# Patient Record
Sex: Female | Born: 1935 | Race: White | Hispanic: No | Marital: Married | Smoking: Never smoker
Health system: Southern US, Community
[De-identification: ages and names within clinical notes are randomized; demographics above are authoritative.]

---

## 2010-07-07 DIAGNOSIS — Z8673 Personal history of transient ischemic attack (TIA), and cerebral infarction without residual deficits: Secondary | ICD-10-CM | POA: Insufficient documentation

## 2010-07-27 DIAGNOSIS — K219 Gastro-esophageal reflux disease without esophagitis: Secondary | ICD-10-CM | POA: Insufficient documentation

## 2010-07-27 DIAGNOSIS — I639 Cerebral infarction, unspecified: Secondary | ICD-10-CM | POA: Insufficient documentation

## 2010-07-27 HISTORY — DX: Gastro-esophageal reflux disease without esophagitis: K21.9

## 2010-07-27 HISTORY — DX: Cerebral infarction, unspecified: I63.9

## 2012-10-16 DIAGNOSIS — S43006A Unspecified dislocation of unspecified shoulder joint, initial encounter: Secondary | ICD-10-CM

## 2012-10-16 HISTORY — DX: Unspecified dislocation of unspecified shoulder joint, initial encounter: S43.006A

## 2012-12-06 DIAGNOSIS — Z8739 Personal history of other diseases of the musculoskeletal system and connective tissue: Secondary | ICD-10-CM | POA: Insufficient documentation

## 2012-12-06 DIAGNOSIS — Z87828 Personal history of other (healed) physical injury and trauma: Secondary | ICD-10-CM

## 2012-12-06 HISTORY — DX: Personal history of other diseases of the musculoskeletal system and connective tissue: Z87.39

## 2012-12-06 HISTORY — DX: Personal history of other (healed) physical injury and trauma: Z87.828

## 2012-12-21 DIAGNOSIS — M75100 Unspecified rotator cuff tear or rupture of unspecified shoulder, not specified as traumatic: Secondary | ICD-10-CM

## 2012-12-21 HISTORY — DX: Unspecified rotator cuff tear or rupture of unspecified shoulder, not specified as traumatic: M75.100

## 2014-07-02 DIAGNOSIS — E7800 Pure hypercholesterolemia, unspecified: Secondary | ICD-10-CM

## 2014-07-02 DIAGNOSIS — I872 Venous insufficiency (chronic) (peripheral): Secondary | ICD-10-CM

## 2014-07-02 DIAGNOSIS — D649 Anemia, unspecified: Secondary | ICD-10-CM

## 2014-07-02 DIAGNOSIS — G4700 Insomnia, unspecified: Secondary | ICD-10-CM

## 2014-07-02 DIAGNOSIS — M810 Age-related osteoporosis without current pathological fracture: Secondary | ICD-10-CM | POA: Insufficient documentation

## 2014-07-02 DIAGNOSIS — R2681 Unsteadiness on feet: Secondary | ICD-10-CM

## 2014-07-02 DIAGNOSIS — J309 Allergic rhinitis, unspecified: Secondary | ICD-10-CM | POA: Insufficient documentation

## 2014-07-02 DIAGNOSIS — N3941 Urge incontinence: Secondary | ICD-10-CM | POA: Insufficient documentation

## 2014-07-02 HISTORY — DX: Pure hypercholesterolemia, unspecified: E78.00

## 2014-07-02 HISTORY — DX: Insomnia, unspecified: G47.00

## 2014-07-02 HISTORY — DX: Unsteadiness on feet: R26.81

## 2014-07-02 HISTORY — DX: Age-related osteoporosis without current pathological fracture: M81.0

## 2014-07-02 HISTORY — DX: Urge incontinence: N39.41

## 2014-07-02 HISTORY — DX: Allergic rhinitis, unspecified: J30.9

## 2014-07-02 HISTORY — DX: Venous insufficiency (chronic) (peripheral): I87.2

## 2014-07-02 HISTORY — DX: Anemia, unspecified: D64.9

## 2016-09-06 DIAGNOSIS — F01518 Vascular dementia, unspecified severity, with other behavioral disturbance: Secondary | ICD-10-CM | POA: Insufficient documentation

## 2016-09-06 DIAGNOSIS — G301 Alzheimer's disease with late onset: Secondary | ICD-10-CM | POA: Insufficient documentation

## 2016-09-06 DIAGNOSIS — F0150 Vascular dementia without behavioral disturbance: Secondary | ICD-10-CM | POA: Insufficient documentation

## 2020-01-18 DIAGNOSIS — J3089 Other allergic rhinitis: Secondary | ICD-10-CM | POA: Insufficient documentation

## 2020-01-18 HISTORY — DX: Other allergic rhinitis: J30.89

## 2020-05-01 ENCOUNTER — Ambulatory Visit: Payer: Medicare Other | Attending: Family Medicine | Admitting: Physical Therapy

## 2020-05-01 ENCOUNTER — Ambulatory Visit (INDEPENDENT_AMBULATORY_CARE_PROVIDER_SITE_OTHER): Payer: Medicare Other | Admitting: Family Medicine

## 2020-05-01 ENCOUNTER — Encounter: Payer: Self-pay | Admitting: Physical Therapy

## 2020-05-01 ENCOUNTER — Other Ambulatory Visit: Payer: Self-pay

## 2020-05-01 DIAGNOSIS — G8929 Other chronic pain: Secondary | ICD-10-CM | POA: Insufficient documentation

## 2020-05-01 DIAGNOSIS — M25612 Stiffness of left shoulder, not elsewhere classified: Secondary | ICD-10-CM | POA: Diagnosis not present

## 2020-05-01 DIAGNOSIS — R2681 Unsteadiness on feet: Secondary | ICD-10-CM | POA: Diagnosis present

## 2020-05-01 DIAGNOSIS — I10 Essential (primary) hypertension: Secondary | ICD-10-CM | POA: Diagnosis not present

## 2020-05-01 DIAGNOSIS — M25471 Effusion, right ankle: Secondary | ICD-10-CM

## 2020-05-01 DIAGNOSIS — J45909 Unspecified asthma, uncomplicated: Secondary | ICD-10-CM

## 2020-05-01 DIAGNOSIS — R531 Weakness: Secondary | ICD-10-CM

## 2020-05-01 DIAGNOSIS — K219 Gastro-esophageal reflux disease without esophagitis: Secondary | ICD-10-CM

## 2020-05-01 DIAGNOSIS — G301 Alzheimer's disease with late onset: Secondary | ICD-10-CM | POA: Diagnosis not present

## 2020-05-01 DIAGNOSIS — M25512 Pain in left shoulder: Secondary | ICD-10-CM

## 2020-05-01 DIAGNOSIS — E7800 Pure hypercholesterolemia, unspecified: Secondary | ICD-10-CM | POA: Diagnosis not present

## 2020-05-01 DIAGNOSIS — R351 Nocturia: Secondary | ICD-10-CM

## 2020-05-01 DIAGNOSIS — Z8673 Personal history of transient ischemic attack (TIA), and cerebral infarction without residual deficits: Secondary | ICD-10-CM

## 2020-05-01 DIAGNOSIS — M25472 Effusion, left ankle: Secondary | ICD-10-CM

## 2020-05-01 DIAGNOSIS — M79602 Pain in left arm: Secondary | ICD-10-CM | POA: Diagnosis not present

## 2020-05-01 DIAGNOSIS — M6281 Muscle weakness (generalized): Secondary | ICD-10-CM | POA: Diagnosis present

## 2020-05-01 HISTORY — DX: Personal history of transient ischemic attack (TIA), and cerebral infarction without residual deficits: Z86.73

## 2020-05-01 HISTORY — DX: Nocturia: R35.1

## 2020-05-01 HISTORY — DX: Pure hypercholesterolemia, unspecified: E78.00

## 2020-05-01 HISTORY — DX: Essential (primary) hypertension: I10

## 2020-05-01 HISTORY — DX: Unspecified asthma, uncomplicated: J45.909

## 2020-05-01 MED ORDER — FAMOTIDINE 20 MG PO TABS: 20.0000 mg | ORAL_TABLET | Freq: Two times a day (BID) | ORAL | 3 refills | Status: DC

## 2020-05-01 NOTE — Progress Notes (Signed)
S: Pharmacy consulted to complete medication reconciliation for geriatric clinic. Patient arrives in good spirits today accompanied with daughter. Reports that granddaughter, who is a Engineer, civil (consulting), is concerned about several of the patient's medication, including use of clopidogrel instead of aspirin, and the "blood pressure medications." Patient also reports swelling in ankles. Patient denies symptoms of acid reflux (burning, regurgitation). Patient and daughter cannot recall date of stroke, although problem list indicates stroke may have occurred in 2011, and denies use of aspirin in the past, or allergy to aspirin. Daughter reports that duloxetine was started after stroke due to "anxiousness from the stroke.".   O:  BP 118/74  Medication concerns identified:   Olmesartan 20 mg daily   Amlodipine-benazepril 5-10 mg daily  Omeprazole 40 mg daily  Duloxetine 30 mg daily  Clopidogrel 75 mg daily    A/P: Patient concurrently taking ACE and ARB which increases risk for hyperkalemia and AKI. Recommend discontinuing amlodipine-benazpril combination medication, and continuing olmesartan 20 mg daily, given sx's of peripheral edema in the lower extremities. BP is at goal today, will continue to monitor need for additional therapy. Patient taking proton pump inhibitor without symptoms of acid reflux. Given patient's age and coexisting osteopenia and after discussion with Dr. Georgeann Oppenheim, will plan to discontinue omeprazole 40 mg and start famotidine 20 mg BID. Patient is unsure if she is receiving clinical benefit from duloxetine as she has been taking the medication continuously since her stroke occurred, without a trial off. Can consider discontinuing/tapering off in future visits. Unable to discern if patient has had a trial of aspirin in the past. If family wishes to switch from clopidogrel to aspirin, may consider doing so today or at future visits.  Patient was seen by Cordella Register, PharmD Candidate 2022  and Fabio Neighbors, PGY2 Pharmacy Resident.

## 2020-05-01 NOTE — Addendum Note (Signed)
Addended by: Jenelle Mages on: 05/01/2020 04:46 PM   Modules accepted: Orders

## 2020-05-01 NOTE — Patient Instructions (Addendum)
                 Patient was evaluated by Physical Therapist today during multi-disciplinary Geriatric Clinic. Screening questions and medication/history review were completed by the medical team. Please see MD encounter for full assessment.   Garen Lah, PT Certified Exercise Expert for the Aging Adult  [facility]- Aquatic Therapy Certified 05/01/20 3:44 PM Phone: [phone] Fax: [phone]

## 2020-05-01 NOTE — Patient Instructions (Addendum)
It was so very nice to meet you Sharon Dodson.  PLease consider stopping the medication Lotrel (Amlodipine & Benazepril).  Please continue omlesartan   Please stop the medication Namenda (memantine)  We will talk about stopping omeprazole and duloxetine at our next visit.

## 2020-05-01 NOTE — Therapy (Addendum)
[facility] [address] [place], [place], [address] Phone: [phone]   Fax:  [phone]  Physical Therapy Evaluation  Patient Details  Name: Sharon Dodson MRN: [id number] Date of Birth: Mar 03, 1932 Referring Provider (PT): McDiarmid, Tawanna Cooler MD   Encounter Date: 05/01/2020   PT End of Session - 05/01/20 1607    Visit Number 1    Number of Visits 1    Date for PT Re-Evaluation 05/01/20    Authorization Type MCR    Progress Note Due on Visit 10    PT Start Time 1515    PT Stop Time 1554    PT Time Calculation (min) 39 min    Activity Tolerance Patient tolerated treatment well    Behavior During Therapy [facility] for tasks assessed/performed           Past Medical History:  Diagnosis Date  . Allergic rhinitis 07/02/2014  . Anemia 07/02/2014  . Benign essential HTN 05/01/2020  . Environmental and seasonal allergies 01/18/2020  . H/O reduction of closed dislocation 12/06/2012  . History of cerebral infarction 05/01/2020   Formatting of this note might be different from the original. 07/2010, RIGHT parietal lobe, without residual effects  . Nocturia 05/01/2020  . Pure hypercholesterolemia 05/01/2020  . RCT (rotator cuff tear) 12/21/2012  . Shoulder dislocation 10/16/2012  . Uncomplicated asthma 05/01/2020  . Unsteady gait 07/02/2014  . Urgency incontinence 07/02/2014  . Venous insufficiency 07/02/2014    History reviewed. No pertinent surgical history.  There were no vitals filed for this visit.        [facility] PT Assessment - 05/01/20 0001      Assessment   Medical Diagnosis unsteadiness on feet, LT shld pain and generalized muscle weakness    Referring Provider (PT) McDiarmid, Todd MD      Balance Screen   Has the patient fallen in the past 6 months No   fell 5 years ago and injured shoulder   Has the patient had a decrease in activity level because of a fear of falling?  No    Is the patient reluctant to leave their home  because of a fear of falling?  No      Observation/Other Assessments   Focus on Therapeutic Outcomes (FOTO)  NA  Geriatric Clinic      ROM / Strength   AROM / PROM / Strength AROM      AROM   Overall AROM  Deficits    Right Shoulder Flexion 150 Degrees    Right Shoulder ABduction 140 Degrees    Left Shoulder Flexion 70 Degrees    Left Shoulder ABduction 62 Degrees    Right Hip Flexion 80    Left Hip Flexion 80    Right Knee Extension 5    Right Knee Flexion 110    Left Knee Extension 5    Left Knee Flexion 115      Strength   Overall Strength Deficits    Overall Strength Comments grossly weaker in proximal hips than distal , Pt sits a lot at home and is decondeitioned.    grossly 3/5 to 4-/5           [facility] Pre-Surgical Assessment - 05/01/20 0001    5 Meter Walk Test- trial 1 10.4 sec    5 Meter Walk Test- trial 2 10 sec.     5 Meter Walk Test- trial 3 10.6 sec.    5 meter walk test average 10.33 sec  4 Stage Balance Test tolerated for:  4 sec.    4 Stage Balance Test Position 3    comment SPPB 6/12    Sit To Stand Test- trial 1 19.6 sec.    Comment > 15 sec risk for recurrent falls                    Objective measurements completed on examination: See above findings.          Access Code: [email or web address]: [email or web address]: 08/26/2021Prepared by: Wayland Denis BeardsleyExercises  Sit to Stand with Counter Support - 3 x daily - 7 x weekly - 1 sets - 5-10 reps  Standing Hip Abduction with Counter Support - 1 x daily - 7 x weekly - 2 sets - 10 reps  Standing March with Counter Support - 1 x daily - 7 x weekly - 2 sets - 10 reps  Standing Hip Extension with Counter Support - 1 x daily - 7 x weekly - 2 sets - 10 reps         PT Short Term Goals - 05/01/20 1641      PT SHORT TERM GOAL #1   Title Pt will recieve and demo initial HEP    Baseline no knowledge    Time 1    Period Days    Status Achieved    Target Date 05/01/20                Clinical Impresssion Statement 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 Pt presents with onset of shoulder pain for around 5 years and was recommended for  TSR of LT shoulder. Before her move. Her dtr reported she holds onto walls in order for mobility.  She does have a cane to use and PT observed pt blocking knees for stability with standing.    Symptoms are limiting balance /strength . See Flowsheet for AROM/strength  , Decreased  balance and score of 3/4 on. High fall risk 4 stage balance test.  1.65ft/sec walking speed. Pt performed 5xSTS in 19.59 seconds which is indicative of high fall risk and decreased LE strength   Based on Short Physical Performance Battery, pt has a frailty rating of 6/12 with </= 5/12 considered frail and a score of </= 10/12 indicates one or more mobility limitations.  Pt would benefit from skilled PT to address impairments and decrease fall risk.  Pt given RX for continued PT when Pt/family feels safer to venture outside of home due to COVID concerns    Household Ambulator ;1.31 ft/sec or below: Limited ommunity ambulator; 1.31 ft/sec to2.62 ft/sec : [employer]; 2.62 ft/sec and above: Able to safely cross streets   Patient was evaluated by Physical Therapist today during multi-disciplinary Geriatric Clinic. Screening questions and medication/history review were completed by the medical team. Please see MD encounter for full assessment.      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 Pt presents with onset of shoulder pain for around 5 years and was recommended for  TSR of LT shoulder. Before her move. Her dtr reported she holds onto walls in order for mobility.  She does have a cane to use and PT observed pt blocking knees for stability with standing.    Symptoms are  limiting balance /strength . See Flowsheet for AROM/strength  ,  Decreased  balance and score of 3/4 on. High fall risk 4 stage balance test.  1.24ft/sec walking speed. Pt performed 5xSTS in 19.59 seconds which is indicative of high fall risk and decreased LE strength  Based on Short Physical Performance Battery, pt has a frailty rating of 6/12 with </= 5/12 considered frail and a score of </= 10/12 indicates one or more mobility limitations.  Pt would benefit from skilled PT to address impairments and decrease fall risk.  Pt given RX for continued PT when Pt/family feels safer to venture outside of home. Household Ambulator ;1.31 ft/sec or below: Limited ommunity ambulator; 1.31 ft/sec to2.62 ft/sec : [employer]; 2.62 ft/sec and above: Able to safely cross streets .  Pt was given RX to use for PT when pt/dtr feel safe to make additional appt/re evaluation.    Stability/Clinical Decision Making Evolving/Moderate complexity    Clinical Decision Making Moderate    Rehab Potential Good    PT Frequency One time visit    PT Treatment/Interventions Functional mobility training;Gait training;Therapeutic exercise    PT Home Exercise Plan HEP for LE strength/balance    Consulted and Agree with Plan of Care Patient;Family member/caregiver           Patient will benefit from skilled therapeutic intervention in order to improve the following deficits and impairments:    Pt /dtr wanted evaluation today but would like to defer further clinic PT at later time due to concerns about COVID upsurge Visit Diagnosis: Unsteadiness on feet  Chronic left shoulder pain  Muscle weakness (generalized)     Problem List Patient Active Problem List   Diagnosis Date Noted  . Benign essential HTN 05/01/2020  . Gastroesophageal reflux disease without esophagitis 05/01/2020  . History of cerebral infarction 05/01/2020  . Pure hypercholesterolemia 05/01/2020  . Environmental and seasonal allergies 01/18/2020    . Alzheimer's disease with late onset (CODE) (HCC) 09/06/2016  . Allergic rhinitis 07/02/2014  . Insomnia 07/02/2014  . Osteopenia 07/02/2014    Garen Lah, PT Certified Exercise Expert for the Aging Adult  [facility]- Aquatic Therapy Certified 05/01/20 4:42 PM Phone: [phone] Fax: [phone]  [facility] Outpatient Rehabilitation [facility] [address] [place], [place], [address] Phone: [phone]   Fax:  [phone]  Name: Sharon Dodson MRN: [id number] Date of Birth: 1931-12-02

## 2020-05-02 ENCOUNTER — Encounter: Payer: Self-pay | Admitting: Family Medicine

## 2020-05-02 DIAGNOSIS — M25612 Stiffness of left shoulder, not elsewhere classified: Secondary | ICD-10-CM | POA: Insufficient documentation

## 2020-05-02 DIAGNOSIS — M25471 Effusion, right ankle: Secondary | ICD-10-CM

## 2020-05-02 DIAGNOSIS — M25472 Effusion, left ankle: Secondary | ICD-10-CM

## 2020-05-02 HISTORY — DX: Effusion, left ankle: M25.471

## 2020-05-02 HISTORY — DX: Stiffness of left shoulder, not elsewhere classified: M25.612

## 2020-05-02 HISTORY — DX: Effusion, left ankle: M25.472

## 2020-05-02 NOTE — Assessment & Plan Note (Signed)
Established problem Sharon Dodson has had PT for shoulder.   Pt has had rotator cuff tear listed.

## 2020-05-02 NOTE — Assessment & Plan Note (Addendum)
Established problem 06/2019 Lipid panel: LDL 61, HDL 85, TG 85, Tchol 161 Controlled Continue Pravachol 80 mg daily

## 2020-05-02 NOTE — Assessment & Plan Note (Signed)
Established problem Asymptomatic Stop PPI Start famotidine 20 mg twice a day Goal of stopping famotidine in future

## 2020-05-02 NOTE — Assessment & Plan Note (Signed)
Established problem Contorolled.  Stopping Lotrel to avoid ACEI in combination with ARB antihypertensive and decrease possible Amlodipine contribution to Sharon Dodson's ankle edema.

## 2020-05-02 NOTE — Assessment & Plan Note (Signed)
Established problem in [place] No records available at this time. Some memory impairment, but able to play Bridge successfully.  Suspect if true dementia is present, it is more likely of vascular type, or mixed type.  It stage would be mild.  Recommend stopping memantine.  Reassess for change in cognition or function in 4 weeks.  Can address need for duloxetine at next ov.

## 2020-05-02 NOTE — Assessment & Plan Note (Addendum)
Established problem. Stable. Continue clopidogrel, pravastatin, and olmesartan.

## 2020-05-02 NOTE — Assessment & Plan Note (Signed)
Established problem Stopping Amlodipine. Will see if helps at next ov.

## 2020-05-02 NOTE — Progress Notes (Signed)
[facility] Ger[facility] Clinic:   Patient is accompanied by: daughter Primary caregiver: lives with Husband in independent home at [place] at [facility] senior living community. Dgt lives in [place] and checks on her parents frequently Patient's lives with their spouse. Patient information was obtained from patient, relative(s) and past medical records. History/Exam limitations: none. Primary Care Provider: Ryin Ambrosius, Leighton Roach, MD Referring provider: Self-referral Reason for referral:  Chief Complaint  Patient presents with  . Memory Loss  . mobility   Previous Report Reviewed: historical medical records, lab reports and office notes    Patient's Care Team No care team member to display  ----------------------------------------------------------------------------------------------------------------------------------------------------------------------------------------------------------------------------------------------------------------   HPI by problems:  Chief Complaint  Patient presents with  . Memory Loss  . mobility    Cognitive impairment concern  Are there problems with thinking?  memory loss, but still planning cards well, Bridge. Diagnosis of dementia was in [place] several years ago, perhaps in relation to her stroke.   Do they still have interests or activities they enjor doing?  yes, Ms Cadmus is very social.   How has their appetite been lately?  show no change  There is some difficulty with remembering recent conversations. No difficulty remember day and month. No problems with getting lost.  Voluntarily stopped dring several years ago.  No change in personality.    Behavioral and Psychological Symptoms of Dementia (relevant or irrelevant): No  Depression screen [facility] 2/9 05/01/2020  Decreased Interest 0  Down, Depressed, Hopeless 0  PHQ - 2 Score 0  Altered sleeping 0  Tired, decreased energy 0  Change in appetite 0  Feeling bad or failure about  yourself  0  Trouble concentrating 0  Moving slowly or fidgety/restless 0  Suicidal thoughts 0  PHQ-9 Score 0          Outpatient Encounter Medications as of 05/01/2020  Medication Sig  . clopidogrel (PLAVIX) 75 MG tablet Take 1 tablet by mouth daily.  . Cyanocobalamin (VITAMIN B-12 PO) Take 1 tablet by mouth daily.   Marland Kitchen desloratadine (CLARINEX) 5 MG tablet Take 5 mg by mouth daily.   . DULoxetine (CYMBALTA) 30 MG capsule Take 1 capsule by mouth daily.  . memantine (NAMENDA) 10 MG tablet Take 1 tablet by mouth 2 (two) times daily.  . Multiple Vitamins-Minerals (PRESERVISION AREDS 2) CAPS Take 1 capsule by mouth daily.  Marland Kitchen olmesartan (BENICAR) 20 MG tablet Take 1 tablet by mouth daily.  . pravastatin (PRAVACHOL) 80 MG tablet Take 1 tablet by mouth daily.  . [DISCONTINUED] amLODipine-benazepril (LOTREL) 5-20 MG capsule Take 1 capsule by mouth daily.  . [DISCONTINUED] omeprazole (PRILOSEC) 40 MG capsule Take 1 capsule by mouth daily.  . famotidine (PEPCID) 20 MG tablet Take 1 tablet (20 mg total) by mouth 2 (two) times daily.     History Patient Active Problem List   Diagnosis Date Noted  . Alzheimer's disease with late onset (CODE) (HCC) 09/06/2016    Priority: High  . Benign essential HTN 05/01/2020    Priority: Medium  . Hisotry of Gastroesophageal reflux disease without esophagitis 05/01/2020    Priority: Medium  . History of cerebral infarction 05/01/2020    Priority: Medium  . Pure hypercholesterolemia 05/01/2020    Priority: Medium  . Environmental and seasonal allergies 01/18/2020    Priority: Low  . Allergic rhinitis 07/02/2014    Priority: Low  . Insomnia 07/02/2014    Priority: Low  . Osteopenia 07/02/2014    Priority: Low   Past Medical History:  Diagnosis Date  . Allergic rhinitis 07/02/2014  . Anemia 07/02/2014  . Benign essential HTN 05/01/2020  . Environmental and seasonal allergies 01/18/2020  . H/O reduction of closed dislocation 12/06/2012  . History  of cerebral infarction 05/01/2020   07/2010, RIGHT parietal lobe, without residual effects  . Nocturia 05/01/2020  . Pure hypercholesterolemia 05/01/2020  . RCT (rotator cuff tear) 12/21/2012  . Shoulder dislocation 10/16/2012  . Uncomplicated asthma 05/01/2020  . Unsteady gait 07/02/2014  . Urgency incontinence 07/02/2014  . Venous insufficiency 07/02/2014   No past surgical history on file. No family history on file. Social History   Socioeconomic History  . Marital status: Married    Spouse name: Not on file  . Number of children: Not on file  . Years of education: >6   . Highest education level: Master's degree (e.g., MA, MS, MEng, MEd, MSW, MBA)  Occupational History  . Occupation: Doctor, general practice    Comment: retired  Tobacco Use  . Smoking status: Never Smoker  . Smokeless tobacco: Never Used    Cardiovascular Risk Factors: History of stroke  Educational History: >16  years formal education Personal History of Seizures: No -  Personal History of Stroke: Yes - 2011, right parietal infarcttion  Basic Activities of Daily Living  Dressing: Self-care Eating: Self-care Ambulation: Self-care Toileting: Self-care Bathing: Self-care  Instrumental Activities of Daily Living Shopping: Self-care House/Yard Work: N/A Administration of medications: Self-care Finances: N/A Telephone: Self-care Transportation: Partial assistance   Caregivers in home: self  Formal Home Health Assistance   Homemaker services: yes  FALLS in last five office visits:  Fall Risk  05/01/2020  Falls in the past year? 0    Health Maintenance reviewed: Immunization History  Administered Date(s) Administered  . PFIZER SARS-COV-2 Vaccination 10/11/2019, 11/02/2019   Health Maintenance Topics with due status: Overdue     Topic Date Due   DEXA SCAN Never done   TETANUS/TDAP 03/12/2016   Health Maintenance Topics with due status: Due On     Topic Date Due   INFLUENZA VACCINE 04/06/2020     Diet: Regular Nutritional supplements: no  Geriatric Syndromes: Impaired Memory or Cognition yes   Sleep problems yes   Weight loss no Ankle edema: yes  Vital Signs Weight: 169 lb (76.7 kg)  Vitals:   05/01/20 1447  BP: 118/74  Pulse: 77  SpO2: 98%  Weight: 169 lb (76.7 kg)   Wt Readings from Last 3 Encounters:  05/01/20 169 lb (76.7 kg)   Physical Examination:  VS reviewed GEN: Alert, Cooperative, Groomed, NAD  Labs 06/2019  K+ 4.6 Cr 0.8 LDL 61 HDL 85 TG 85 TChol 993   Personal Strengths Ability for insight Active sense of humor Average or above average intelligence Capable of independent living Licensed conveyancer Physical Health Special hobby/interest Supportive family/friends  Support System Strengths Supportive Relationships, Family, Friends and Able to Communicate Effectively   Advanced Directives Advance Directives: Discussion with Ms Bonnielee Haff, this visit.    Assessment and Plan: Please see individual consultation notes from physical therapy, pharmacy and social work for today.    Problem List Items Addressed This Visit      Medium   Gastroesophageal reflux disease without esophagitis   Relevant Medications   famotidine (PEPCID) 20 MG tablet     Low   Decreased range of motion of left shoulder    Other Visit Diagnoses    Unsteadiness on feet       Relevant Orders  Ambulatory referral to Physical Therapy   Left arm pain       Weakness         No problem-specific Assessment & Plan notes found for this encounter.      PHYSICAL THERAPY assessment and plan A physical therapy evaluation was completed during today's geriatric interdisciplinary assessment clinic. The patient will benefit from further skilled PT services at this time. Prescription for outpatient PT given to patient so they may pursue PT once they feel confident to have outpt care during covid.  Please see the Physical Therapy evaluation note  in the chart for additional information.    > 60 minutes face to face were spent in total with interdisciplinary discussion, patient and caretaker counseling and coordination of care took more than 20 minutes. The Geriatric interdisciplinary team meet to discuss the patient's assessment, problem list, and recommendations.  The interdisciplinary team consisted of representatives from medicine, pharmacy, physical therapy and social work. The interdisciplinary team meet with the patient and caretakers to review the team's findings, assessments, and recommendations.

## 2020-05-27 ENCOUNTER — Encounter: Payer: Self-pay | Admitting: Family Medicine

## 2020-05-27 NOTE — Progress Notes (Unsigned)
Order signed for a walker, folding, adjustable/fixed height ([id number]) To [facility] 05/27/20

## 2020-05-29 ENCOUNTER — Encounter: Payer: Self-pay | Admitting: Family Medicine

## 2020-05-29 ENCOUNTER — Other Ambulatory Visit: Payer: Self-pay

## 2020-05-29 ENCOUNTER — Ambulatory Visit (INDEPENDENT_AMBULATORY_CARE_PROVIDER_SITE_OTHER): Payer: Medicare Other | Admitting: Family Medicine

## 2020-05-29 VITALS — BP 118/70 | Wt 168.0 lb

## 2020-05-29 DIAGNOSIS — G301 Alzheimer's disease with late onset: Secondary | ICD-10-CM

## 2020-05-29 DIAGNOSIS — I10 Essential (primary) hypertension: Secondary | ICD-10-CM | POA: Diagnosis not present

## 2020-05-29 DIAGNOSIS — K219 Gastro-esophageal reflux disease without esophagitis: Secondary | ICD-10-CM | POA: Diagnosis not present

## 2020-05-29 DIAGNOSIS — Z23 Encounter for immunization: Secondary | ICD-10-CM

## 2020-05-29 DIAGNOSIS — I872 Venous insufficiency (chronic) (peripheral): Secondary | ICD-10-CM

## 2020-05-29 MED ORDER — TRIAMCINOLONE ACETONIDE 0.1 % EX OINT: 1.0000 "application " | TOPICAL_OINTMENT | Freq: Two times a day (BID) | CUTANEOUS | 0 refills | Status: DC

## 2020-05-29 NOTE — Patient Instructions (Addendum)
Please stop taking the Lotrel (Amlodipine/Benazepril). Continue taking the Omlesartan.  Please stop taking the Omeprazole.  Start taking the famotidine (Pepcid).  Do not restart Namenda (memantine)  You received the Flu vaccination and CoViD booster vaccination today.   Dr Kecia Swoboda would like to see you again in about a month to see how check your blood pressure.   Please send requests for any of your current medication refills to Dr Tysen Roesler.

## 2020-05-29 NOTE — Progress Notes (Addendum)
  Sharon Dodson is accompanied by daughter, [name].  Sources of clinical information for visit is/are patient, relative(s) and past medical records. Nursing assessment for this office visit was reviewed with the patient for accuracy and revision.     Previous Report(s) Reviewed: office notes  Depression screen [facility] 2/9 05/29/2020  Decreased Interest 0  Down, Depressed, Hopeless 0  PHQ - 2 Score 0  Altered sleeping 2  Tired, decreased energy 0  Change in appetite 0  Feeling bad or failure about yourself  0  Trouble concentrating 0  Moving slowly or fidgety/restless 0  Suicidal thoughts 0  PHQ-9 Score 2  Difficult doing work/chores Not difficult at all    Fall Risk  05/29/2020 05/01/2020  Falls in the past year? 0 0    PHQ9 SCORE ONLY 05/29/2020 05/01/2020  PHQ-9 Total Score 2 0    Adult vaccines due  Topic Date Due  . TETANUS/TDAP  03/12/2016    Health Maintenance Due  Topic Date Due  . DEXA SCAN  Never done  . PNA vac Low Risk Adult (2 of 2 - PPSV23) 08/01/2015  . TETANUS/TDAP  03/12/2016      History/P.E. limitations: dementia  Adult vaccines due  Topic Date Due  . TETANUS/TDAP  03/12/2016   There are no preventive care reminders to display for this patient.  Health Maintenance Due  Topic Date Due  . DEXA SCAN  Never done  . PNA vac Low Risk Adult (2 of 2 - PPSV23) 08/01/2015  . TETANUS/TDAP  03/12/2016     Chief Complaint  Patient presents with  . Follow-up    Visit Problem List with A/P  Benign essential HTN Established problem Controlled Request stopping Lotrel (Amlodipine/Benazapril), continue olmesartan 20 mg daily (max 40 mg daily). RTC month to reassess BP and lower extremity edema   Venous stasis dermatitis of right lower extremity New problem Trial Triamcinolone 0.1% oint daily until resolved Moisturing  Gastroesophageal reflux disease without esophagitis Established problem Controlled Trail of deprescribing by stopping  omeprazole Start famotidine 20 mg BID Goal of stopping famotidine in future   Alzheimer's disease with late onset (HCC) Established problem. Stable. [name] has stopped memantine without significant change in cognition nor function per patient and her dgt.  Recommend continuing off the memantine.                                    Triamcinolone

## 2020-06-03 ENCOUNTER — Encounter: Payer: Self-pay | Admitting: Family Medicine

## 2020-06-03 DIAGNOSIS — I872 Venous insufficiency (chronic) (peripheral): Secondary | ICD-10-CM

## 2020-06-03 HISTORY — DX: Venous insufficiency (chronic) (peripheral): I87.2

## 2020-06-03 NOTE — Assessment & Plan Note (Signed)
Established problem. Stable. Sharon Dodson has stopped memantine without significant change in cognition nor function per patient and her dgt.  Recommend continuing off the memantine.

## 2020-06-03 NOTE — Assessment & Plan Note (Signed)
New problem Trial Triamcinolone 0.1% oint daily until resolved Moisturing

## 2020-06-03 NOTE — Assessment & Plan Note (Signed)
Established problem Controlled Request stopping Lotrel (Amlodipine/Benazapril), continue olmesartan 20 mg daily (max 40 mg daily). RTC month to reassess BP and lower extremity edema

## 2020-06-03 NOTE — Assessment & Plan Note (Signed)
Established problem Controlled Trail of deprescribing by stopping omeprazole Start famotidine 20 mg BID Goal of stopping famotidine in future

## 2020-07-10 ENCOUNTER — Other Ambulatory Visit: Payer: Self-pay

## 2020-07-10 ENCOUNTER — Encounter: Payer: Self-pay | Admitting: Family Medicine

## 2020-07-10 ENCOUNTER — Ambulatory Visit (INDEPENDENT_AMBULATORY_CARE_PROVIDER_SITE_OTHER): Payer: Medicare Other | Admitting: Family Medicine

## 2020-07-10 VITALS — BP 124/76 | Wt 168.0 lb

## 2020-07-10 DIAGNOSIS — H6192 Disorder of left external ear, unspecified: Secondary | ICD-10-CM

## 2020-07-10 DIAGNOSIS — E7800 Pure hypercholesterolemia, unspecified: Secondary | ICD-10-CM | POA: Diagnosis not present

## 2020-07-10 DIAGNOSIS — I10 Essential (primary) hypertension: Secondary | ICD-10-CM | POA: Diagnosis not present

## 2020-07-10 DIAGNOSIS — Z79899 Other long term (current) drug therapy: Secondary | ICD-10-CM | POA: Diagnosis not present

## 2020-07-10 DIAGNOSIS — E875 Hyperkalemia: Secondary | ICD-10-CM

## 2020-07-10 DIAGNOSIS — K219 Gastro-esophageal reflux disease without esophagitis: Secondary | ICD-10-CM

## 2020-07-10 NOTE — Patient Instructions (Addendum)
Your blood pressure is under good control.  Keep taking your olmesartan.   We are checking your cholesterol, electrolytes, liver function, kidney function, and thyroid function.

## 2020-07-11 ENCOUNTER — Other Ambulatory Visit: Payer: Self-pay | Admitting: Family Medicine

## 2020-07-11 ENCOUNTER — Telehealth: Payer: Self-pay | Admitting: Family Medicine

## 2020-07-11 ENCOUNTER — Encounter: Payer: Self-pay | Admitting: Family Medicine

## 2020-07-11 DIAGNOSIS — E875 Hyperkalemia: Secondary | ICD-10-CM

## 2020-07-11 DIAGNOSIS — H6192 Disorder of left external ear, unspecified: Secondary | ICD-10-CM | POA: Insufficient documentation

## 2020-07-11 LAB — CBC
Hematocrit: 41.4 % (ref 34.0–46.6)
Hemoglobin: 14 g/dL (ref 11.1–15.9)
MCH: 32.1 pg (ref 26.6–33.0)
MCHC: 33.8 g/dL (ref 31.5–35.7)
MCV: 95 fL (ref 79–97)
Platelets: 297 10*3/uL (ref 150–450)
RBC: 4.36 x10E6/uL (ref 3.77–5.28)
RDW: 13 % (ref 11.7–15.4)
WBC: 5.4 10*3/uL (ref 3.4–10.8)

## 2020-07-11 LAB — CMP14+EGFR
ALT: 30 IU/L (ref 0–32)
AST: 25 IU/L (ref 0–40)
Albumin/Globulin Ratio: 1.9 (ref 1.2–2.2)
Albumin: 4.3 g/dL (ref 3.6–4.6)
Alkaline Phosphatase: 120 IU/L (ref 44–121)
BUN/Creatinine Ratio: 18 (ref 12–28)
BUN: 15 mg/dL (ref 8–27)
Bilirubin Total: 1 mg/dL (ref 0.0–1.2)
CO2: 24 mmol/L (ref 20–29)
Calcium: 9.7 mg/dL (ref 8.7–10.3)
Chloride: 98 mmol/L (ref 96–106)
Creatinine, Ser: 0.83 mg/dL (ref 0.57–1.00)
GFR calc Af Amer: 74 mL/min/{1.73_m2} (ref >59)
GFR calc non Af Amer: 64 mL/min/{1.73_m2} (ref >59)
Globulin, Total: 2.3 g/dL (ref 1.5–4.5)
Glucose: 71 mg/dL (ref 65–99)
Potassium: 5.7 mmol/L — ABNORMAL HIGH (ref 3.5–5.2)
Sodium: 135 mmol/L (ref 134–144)
Total Protein: 6.6 g/dL (ref 6.0–8.5)

## 2020-07-11 LAB — LIPID PANEL
Chol/HDL Ratio: 2.2 ratio (ref 0.0–4.4)
Cholesterol, Total: 158 mg/dL (ref 100–199)
HDL: 71 mg/dL (ref >39)
LDL Chol Calc (NIH): 73 mg/dL (ref 0–99)
Triglycerides: 71 mg/dL (ref 0–149)
VLDL Cholesterol Cal: 14 mg/dL (ref 5–40)

## 2020-07-11 LAB — TSH: TSH: 2.03 u[IU]/mL (ref 0.450–4.500)

## 2020-07-11 NOTE — Assessment & Plan Note (Signed)
Established problem Well Controlled. Tolerating reduction from omeprazole to famotidine 20 mg daily. I let Sharon Dodson know she could try stopping the famotidine altogether to see if she need even it.

## 2020-07-11 NOTE — Progress Notes (Addendum)
Sharon Dodson is accompanied by daughter, [name] Sources of clinical information for visit is/are patient, relative(s) and past medical records. Nursing assessment for this office visit was reviewed with the patient for accuracy and revision.     Previous Report(s) Reviewed: office notes and vaccination records from [place] Depression screen [facility] 2/9 07/10/2020  Decreased Interest 0  Down, Depressed, Hopeless 0  PHQ - 2 Score 0  Altered sleeping 0  Tired, decreased energy 0  Change in appetite 0  Feeling bad or failure about yourself  0  Trouble concentrating 0  Moving slowly or fidgety/restless 0  Suicidal thoughts 0  PHQ-9 Score 0  Difficult doing work/chores -    Fall Risk  07/10/2020 05/29/2020 05/01/2020  Falls in the past year? 0 0 0    PHQ9 SCORE ONLY 07/10/2020 05/29/2020 05/01/2020  PHQ-9 Total Score 0 2 0    Adult vaccines due  Topic Date Due  . TETANUS/TDAP  03/12/2016    Health Maintenance Due  Topic Date Due  . DEXA SCAN  Never done  . TETANUS/TDAP  03/12/2016      History/P.E. limitations: mild dementia  Adult vaccines due  Topic Date Due  . TETANUS/TDAP  03/12/2016   There are no preventive care reminders to display for this patient.  Health Maintenance Due  Topic Date Due  . DEXA SCAN  Never done  . TETANUS/TDAP  03/12/2016     Chief Complaint  Patient presents with  . Hypertension    Media Information   Document Information  Photos    07/10/2020 11:49  Attached To:  Office Visit on 07/10/20 with Lennan Malone, Leighton Roach, MD  Source Information  Cobe Viney, Leighton Roach, MD  [facility]

## 2020-07-11 NOTE — Progress Notes (Unsigned)
bmet for incr K+

## 2020-07-11 NOTE — Telephone Encounter (Signed)
I spoke with Mrs Chevere's daughter, [name], about her mother's elevated serum potassium.  I asked that Ms Krabbenhoft increase her fluid intake over the weekend, and come into the [facility] lab next week for a recheck BMET.

## 2020-07-11 NOTE — Assessment & Plan Note (Signed)
Lab Results  Component Value Date   CHOL 158 07/10/2020   HDL 71 07/10/2020   LDLCALC 73 07/10/2020   TRIG 71 07/10/2020   CHOLHDL 2.2 07/10/2020   Good control with pravastatin 80 mg daily as secondary prevention in hx of stroke

## 2020-07-11 NOTE — Assessment & Plan Note (Signed)
See Note section for photo of lesion Chronic condition Blanching erythema with mild scaling overlying left ear antitragus cartilage. Possibly palpable lesion (or not) no visible rolled borders.   Appears to be a localized area of mechanical skin irritation over an prominent antitragus. Could this result from sleep on her left side with pressure on the left ear's prominence.  At worst, it could be a basal cell cancer, but I would not recommend biopsy or empiric treatment fgiven Sharon Dodson's age.

## 2020-07-11 NOTE — Assessment & Plan Note (Addendum)
Established problem Well Controlled. No signs of complications, medication side effects, or red flags. Continuejust olmesartan 20 mg daily.  Basic Metabolic Panel:    Component Value Date/Time   NA 135 07/10/2020 1203   K 5.7 (H) 07/10/2020 1203   CL 98 07/10/2020 1203   CO2 24 07/10/2020 1203   BUN 15 07/10/2020 1203   CREATININE 0.83 07/10/2020 1203   GLUCOSE 71 07/10/2020 1203   CALCIUM 9.7 07/10/2020 1203   I will ask Mrs Sharon Dodson to come by again to recheck this serum K+ to see if it persists, requiring a dose reduction or stopping olmesartan

## 2020-07-14 ENCOUNTER — Other Ambulatory Visit: Payer: Self-pay

## 2020-07-14 ENCOUNTER — Other Ambulatory Visit: Payer: Medicare Other

## 2020-07-14 DIAGNOSIS — E875 Hyperkalemia: Secondary | ICD-10-CM

## 2020-07-15 LAB — BASIC METABOLIC PANEL
BUN/Creatinine Ratio: 16 (ref 12–28)
BUN: 13 mg/dL (ref 8–27)
CO2: 23 mmol/L (ref 20–29)
Calcium: 9.2 mg/dL (ref 8.7–10.3)
Chloride: 97 mmol/L (ref 96–106)
Creatinine, Ser: 0.83 mg/dL (ref 0.57–1.00)
GFR calc Af Amer: 74 mL/min/{1.73_m2} (ref >59)
GFR calc non Af Amer: 64 mL/min/{1.73_m2} (ref >59)
Glucose: 87 mg/dL (ref 65–99)
Potassium: 4.5 mmol/L (ref 3.5–5.2)
Sodium: 134 mmol/L (ref 134–144)

## 2020-09-06 DIAGNOSIS — I350 Nonrheumatic aortic (valve) stenosis: Secondary | ICD-10-CM | POA: Insufficient documentation

## 2020-09-06 DIAGNOSIS — I050 Rheumatic mitral stenosis: Secondary | ICD-10-CM | POA: Insufficient documentation

## 2020-10-02 ENCOUNTER — Encounter: Payer: Self-pay | Admitting: Family Medicine

## 2020-10-02 ENCOUNTER — Other Ambulatory Visit: Payer: Self-pay

## 2020-10-02 ENCOUNTER — Ambulatory Visit (INDEPENDENT_AMBULATORY_CARE_PROVIDER_SITE_OTHER): Payer: Medicare Other | Admitting: Family Medicine

## 2020-10-02 VITALS — BP 152/90 | HR 81 | Ht 60.0 in | Wt 167.0 lb

## 2020-10-02 DIAGNOSIS — K439 Ventral hernia without obstruction or gangrene: Secondary | ICD-10-CM | POA: Diagnosis not present

## 2020-10-02 DIAGNOSIS — H6192 Disorder of left external ear, unspecified: Secondary | ICD-10-CM | POA: Diagnosis not present

## 2020-10-02 DIAGNOSIS — G301 Alzheimer's disease with late onset: Secondary | ICD-10-CM | POA: Diagnosis not present

## 2020-10-02 DIAGNOSIS — Z13820 Encounter for screening for osteoporosis: Secondary | ICD-10-CM

## 2020-10-02 DIAGNOSIS — M8580 Other specified disorders of bone density and structure, unspecified site: Secondary | ICD-10-CM

## 2020-10-02 DIAGNOSIS — Z8673 Personal history of transient ischemic attack (TIA), and cerebral infarction without residual deficits: Secondary | ICD-10-CM | POA: Diagnosis not present

## 2020-10-02 DIAGNOSIS — K219 Gastro-esophageal reflux disease without esophagitis: Secondary | ICD-10-CM

## 2020-10-02 DIAGNOSIS — Z780 Asymptomatic menopausal state: Secondary | ICD-10-CM

## 2020-10-02 MED ORDER — DULOXETINE HCL 20 MG PO CPEP: 20.0000 mg | ORAL_CAPSULE | Freq: Every day | ORAL | 0 refills | Status: DC

## 2020-10-02 MED ORDER — MEMANTINE HCL ER 28 MG PO CP24: 28.0000 mg | ORAL_CAPSULE | Freq: Every day | ORAL | 99 refills | Status: DC

## 2020-10-02 NOTE — Patient Instructions (Addendum)
Changed the memantine from twice a day to once a day.   Please try stopping the famotidine - this was for indigestion.  If the indigestion returns, then you may use the famotidine as needed.   Change the duloxetine (Cymbalta) from 30 mg to 20 mg once a day for 28 days then stop altogether.   We will schedule you for a  Bone density test to look for osteoporosis.   Consider taking OsCal-D, two tablets daily (preferrably with food.)    Ventral Hernia  A ventral hernia is a bulge of tissue from inside the abdomen that pushes through a weak area of the muscles that form the front wall of the abdomen. The tissues inside the abdomen are inside a sac (peritoneum). These tissues include the small intestine, large intestine, and the fatty tissue that covers the intestines (omentum). Sometimes, the bulge that forms a hernia contains intestines. Other hernias contain only fat. Ventral hernias do not go away without surgical treatment. There are several types of ventral hernias. You may have:  A hernia at an incision site from previous abdominal surgery (incisional hernia).  A hernia just above the belly button (epigastric hernia), or at the belly button (umbilical hernia). These types of hernias can develop from heavy lifting or straining.  A hernia that comes and goes (reducible hernia). It may be visible only when you lift or strain. This type of hernia can be pushed back into the abdomen (reduced).  A hernia that traps abdominal tissue inside the hernia (incarcerated hernia). This type of hernia does not reduce.  A hernia that cuts off blood flow to the tissues inside the hernia (strangulated hernia). The tissues can start to die if this happens. This is a very painful bulge that cannot be reduced. A strangulated hernia is a medical emergency. What are the causes? This condition is caused by abdominal tissue putting pressure on an area of weakness in the abdominal muscles. What increases the  risk? The following factors may make you more likely to develop this condition:  Being age 85 or older.  Being overweight or obese.  Having had previous abdominal surgery, especially if there was an infection after surgery.  Having had an injury to the abdominal wall.  Frequently lifting or pushing heavy objects.  Having had several pregnancies.  Having a buildup of fluid inside the abdomen (ascites).  Straining to have a bowel movement or to urinate.  Having frequent coughing episodes. What are the signs or symptoms? The only symptom of a ventral hernia may be a painless bulge in the abdomen. A reducible hernia may be visible only when you strain, cough, or lift. Other symptoms may include:  Dull pain.  A feeling of pressure. Signs and symptoms of a strangulated hernia may include:  Increasing pain.  Nausea and vomiting.  Pain when pressing on the hernia.  The skin over the hernia turning red or purple.  Constipation.  Blood in the stool (feces). How is this diagnosed? This condition may be diagnosed based on:  Your symptoms.  Your medical history.  A physical exam. You may be asked to cough or strain while standing. These actions increase the pressure inside your abdomen and force the hernia through the opening in your muscles. Your health care provider may try to reduce the hernia by gently pushing the hernia back in.  Imaging studies, such as an ultrasound or CT scan. How is this treated? This condition is treated with surgery. If you have a  strangulated hernia, surgery is done as soon as possible. If your hernia is small and not incarcerated, you may be asked to lose some weight before surgery. Follow these instructions at home:  Follow instructions from your health care provider about eating or drinking restrictions.  If you are overweight, your health care provider may recommend that you increase your activity level and eat a healthier diet.  Do not  lift anything that is heavier than 10 lb (4.5 kg), or the limit that you are told, until your health care provider says that it is safe.  Return to your normal activities as told by your health care provider. Ask your health care provider what activities are safe for you. You may need to avoid activities that increase pressure on your hernia.  Take over-the-counter and prescription medicines only as told by your health care provider.  Keep all follow-up visits. This is important. Contact a health care provider if:  Your hernia gets larger.  Your hernia becomes painful. Get help right away if:  Your hernia becomes increasingly painful.  You have pain along with any of the following: ? Changes in skin color in the area of the hernia. ? Nausea. ? Vomiting. ? Fever. These symptoms may represent a serious problem that is an emergency. Do not wait to see if the symptoms will go away. Get medical help right away. Call your local emergency services (911 in the [place] Do not drive yourself to the hospital. Summary  A ventral hernia is a bulge of tissue from inside the abdomen that pushes through a weak area of the muscles that form the front wall of the abdomen.  This condition is treated with surgery, which may be urgent depending on your hernia.  Do not lift anything that is heavier than 10 lb (4.5 kg), and follow activity instructions from your health care provider. This information is not intended to replace advice given to you by your health care provider. Make sure you discuss any questions you have with your health care provider. Document Revised: 04/11/2020 Document Reviewed: 04/11/2020 Elsevier Patient Education  2021 [employer].

## 2020-10-03 ENCOUNTER — Encounter: Payer: Self-pay | Admitting: Family Medicine

## 2020-10-03 DIAGNOSIS — K439 Ventral hernia without obstruction or gangrene: Secondary | ICD-10-CM

## 2020-10-03 DIAGNOSIS — K429 Umbilical hernia without obstruction or gangrene: Secondary | ICD-10-CM | POA: Insufficient documentation

## 2020-10-03 HISTORY — DX: Ventral hernia without obstruction or gangrene: K43.9

## 2020-10-03 NOTE — Assessment & Plan Note (Signed)
Established problem that has improved.  The left antitragus without scaling.  Only mild blanching erythema. Sinlg sub-mm white punctum in skin over antitragus.  ? tiny Inclusion cyst - not infected.  Plan: Monitor Pressure relief from hearing aid

## 2020-10-03 NOTE — Assessment & Plan Note (Signed)
Established problem Assessing degree bone density decline post-menopause Recommend OsCal-d two tablets with food once a day

## 2020-10-03 NOTE — Assessment & Plan Note (Signed)
New problem Mrs Uriegas recently noticed bulge in abdomin.  Not painful nor tender.  No N/V.  No constipation.  No abdominal pain.  Exam show ~ 4-5 cm diameter soft mass just superior to the umbilicus.  It is evident with standing and resolves in supine position.  Nontender. Easily reducible. No overlying erythema.   A/ Sliding ventral hernia, asymptomatic P/ Expectant management      Red Flags requiring immediate evaluation reviewed with patient and her dgt.

## 2020-10-03 NOTE — Addendum Note (Signed)
Addended by: Acquanetta Belling D on: 10/03/2020 03:53 PM   Modules accepted: Orders

## 2020-10-03 NOTE — Assessment & Plan Note (Signed)
Established problem Asymptomatic Plan stopping daily famotidine 20 mg May use famotidine 20 mg prn for indigestion  Monitor for tolerance

## 2020-10-03 NOTE — Progress Notes (Signed)
Sharon Dodson is accompanied by daughter [name] Sources of clinical information for visit is/are patient, relative(s) and past medical records. Nursing assessment for this office visit was reviewed with the patient for accuracy and revision.     Previous Report(s) Reviewed: historical medical records and lab reports  Depression screen [facility] 2/9 10/02/2020  Decreased Interest 0  Down, Depressed, Hopeless 0  PHQ - 2 Score 0  Altered sleeping 0  Tired, decreased energy 0  Change in appetite 0  Feeling bad or failure about yourself  0  Trouble concentrating 0  Moving slowly or fidgety/restless 0  Suicidal thoughts 0  PHQ-9 Score 0  Difficult doing work/chores -    Fall Risk  10/02/2020 07/10/2020 05/29/2020 05/01/2020  Falls in the past year? 0 0 0 0  Number falls in past yr: 0 - - -  Injury with Fall? 0 - - -  Risk for fall due to : Impaired balance/gait - - -    PHQ9 SCORE ONLY 10/02/2020 07/10/2020 05/29/2020  PHQ-9 Total Score 0 0 2    Adult vaccines due  Topic Date Due  . TETANUS/TDAP  03/12/2016    Health Maintenance Due  Topic Date Due  . DEXA SCAN  Never done  . TETANUS/TDAP  03/12/2016      History/P.E. limitations: mild dementia - no impairment of language nor speech  Adult vaccines due  Topic Date Due  . TETANUS/TDAP  03/12/2016   There are no preventive care reminders to display for this patient.  Health Maintenance Due  Topic Date Due  . DEXA SCAN  Never done  . TETANUS/TDAP  03/12/2016     Chief Complaint  Patient presents with  . Follow-up  . bulge in stomach area

## 2020-10-03 NOTE — Assessment & Plan Note (Addendum)
Established problem. Stable. Family has restarted memantine 10 mg BID bc concern for worsening short-term memory.  After discussion, starting memantine to once a day dosing, memantine ER CAP 28 mg daily.  Stopping immediate release memantine 10 mg.   Monitor for GI upset ---------------------------------------------------------------------------------- Decrease duloxetine to 20 mg daily for 4 weeks then stop Monitor for mood related disorder symptoms/signs

## 2020-10-15 ENCOUNTER — Encounter: Payer: Self-pay | Admitting: Family Medicine

## 2020-10-20 ENCOUNTER — Other Ambulatory Visit: Payer: Self-pay | Admitting: Family Medicine

## 2020-10-20 DIAGNOSIS — Z780 Asymptomatic menopausal state: Secondary | ICD-10-CM

## 2020-10-20 DIAGNOSIS — M8589 Other specified disorders of bone density and structure, multiple sites: Secondary | ICD-10-CM

## 2020-10-21 ENCOUNTER — Telehealth: Payer: Self-pay

## 2020-10-21 NOTE — Telephone Encounter (Signed)
Received fax from pharmacy, PA needed on Memantine 28mg . Clinical questions submitted via Cover My Meds. Waiting on response, could take up to 72 hours.  Cover My Meds info: Key: 

## 2020-11-13 ENCOUNTER — Other Ambulatory Visit: Payer: Self-pay

## 2020-11-13 ENCOUNTER — Encounter: Payer: Self-pay | Admitting: Family Medicine

## 2020-11-13 ENCOUNTER — Ambulatory Visit (HOSPITAL_COMMUNITY)
Admission: RE | Admit: 2020-11-13 | Discharge: 2020-11-13 | Disposition: A | Discharge status: Home / Self Care | Payer: Medicare Other | Source: Ambulatory Visit | Attending: Family Medicine | Admitting: Family Medicine

## 2020-11-13 ENCOUNTER — Ambulatory Visit (INDEPENDENT_AMBULATORY_CARE_PROVIDER_SITE_OTHER): Payer: Medicare Other | Admitting: Family Medicine

## 2020-11-13 VITALS — BP 126/80 | HR 90 | Ht 60.0 in | Wt 163.5 lb

## 2020-11-13 DIAGNOSIS — K219 Gastro-esophageal reflux disease without esophagitis: Secondary | ICD-10-CM

## 2020-11-13 DIAGNOSIS — G301 Alzheimer's disease with late onset: Secondary | ICD-10-CM | POA: Diagnosis not present

## 2020-11-13 DIAGNOSIS — I482 Chronic atrial fibrillation, unspecified: Secondary | ICD-10-CM | POA: Diagnosis not present

## 2020-11-13 DIAGNOSIS — I872 Venous insufficiency (chronic) (peripheral): Secondary | ICD-10-CM | POA: Diagnosis not present

## 2020-11-13 DIAGNOSIS — R009 Unspecified abnormalities of heart beat: Secondary | ICD-10-CM | POA: Diagnosis not present

## 2020-11-13 DIAGNOSIS — I4891 Unspecified atrial fibrillation: Secondary | ICD-10-CM

## 2020-11-13 MED ORDER — MEMANTINE HCL 10 MG PO TABS: 10.0000 mg | ORAL_TABLET | Freq: Two times a day (BID) | ORAL | 3 refills | Status: DC

## 2020-11-13 MED ORDER — TRIAMCINOLONE ACETONIDE 0.1 % EX OINT: 1.0000 "application " | TOPICAL_OINTMENT | Freq: Two times a day (BID) | CUTANEOUS | 3 refills | Status: DC

## 2020-11-13 NOTE — Progress Notes (Signed)
Sharon Dodson is accompanied by daughter, [name]. Sources of clinical information for visit is/are patient, relative(s) and past medical records. Nursing assessment for this office visit was reviewed with the patient for accuracy and revision.     Previous Report(s) Reviewed: office notes  Depression screen [facility] 2/9 11/13/2020  Decreased Interest 0  Down, Depressed, Hopeless 0  PHQ - 2 Score 0  Altered sleeping 0  Tired, decreased energy 0  Change in appetite 0  Feeling bad or failure about yourself  0  Trouble concentrating 0  Moving slowly or fidgety/restless 0  Suicidal thoughts 0  PHQ-9 Score 0  Difficult doing work/chores -    Fall Risk  11/13/2020 10/02/2020 07/10/2020 05/29/2020 05/01/2020  Falls in the past year? 0 0 0 0 0  Number falls in past yr: 0 0 - - -  Injury with Fall? 0 0 - - -  Risk for fall due to : - Impaired balance/gait - - -    PHQ9 SCORE ONLY 11/13/2020 10/02/2020 07/10/2020  PHQ-9 Total Score 0 0 0    Adult vaccines due  Topic Date Due   TETANUS/TDAP  03/12/2016    Health Maintenance Due  Topic Date Due   DEXA SCAN  Never done   TETANUS/TDAP  03/12/2016      History/P.E. limitations: memory impairment  Adult vaccines due  Topic Date Due   TETANUS/TDAP  03/12/2016   There are no preventive care reminders to display for this patient.  Health Maintenance Due  Topic Date Due   DEXA SCAN  Never done   TETANUS/TDAP  03/12/2016     Chief Complaint  Patient presents with   Dementia

## 2020-11-13 NOTE — Patient Instructions (Signed)
Start Eliquis (apixaban) twice a day.   If you have not heard from Dr Harvie Bridge office by Tuesday, please let Dr Nirvi Boehler know.    If your heart starts to race, go to ED.      Atrial Fibrillation  Atrial fibrillation is a type of irregular or rapid heartbeat (arrhythmia). In atrial fibrillation, the top part of the heart (atria) beats in an irregular pattern. This makes the heart unable to pump blood normally and effectively. The goal of treatment is to prevent blood clots from forming, control your heart rate, or restore your heartbeat to a normal rhythm. If this condition is not treated, it can cause serious problems, such as a weakened heart muscle (cardiomyopathy) or a stroke. What are the causes? This condition is often caused by medical conditions that damage the heart's electrical system. These include:  High blood pressure (hypertension). This is the most common cause.  Certain heart problems or conditions, such as heart failure, coronary artery disease, heart valve problems, or heart surgery.  Diabetes.  Overactive thyroid (hyperthyroidism).  Obesity.  Chronic kidney disease. In some cases, the cause of this condition is not known. What increases the risk? This condition is more likely to develop in:  Older people.  People who smoke.  Athletes who do endurance exercise.  People who have a family history of atrial fibrillation.  Men.  People who use drugs.  People who drink a lot of alcohol.  People who have lung conditions, such as emphysema, pneumonia, or COPD.  People who have obstructive sleep apnea. What are the signs or symptoms? Symptoms of this condition include:  A feeling that your heart is racing or beating irregularly.  Discomfort or pain in your chest.  Shortness of breath.  Sudden light-headedness or weakness.  Tiring easily during exercise or activity.  Fatigue.  Syncope (fainting).  Sweating. In some cases, there are no  symptoms. How is this diagnosed? Your health care provider may detect atrial fibrillation when taking your pulse. If detected, this condition may be diagnosed with:  An electrocardiogram (ECG) to check electrical signals of the heart.  An ambulatory cardiac monitor to record your heart's activity for a few days.  A transthoracic echocardiogram (TTE) to create pictures of your heart.  A transesophageal echocardiogram (TEE) to create even closer pictures of your heart.  A stress test to check your blood supply while you exercise.  Imaging tests, such as a CT scan or chest X-ray.  Blood tests. How is this treated? Treatment depends on underlying conditions and how you feel when you experience atrial fibrillation. This condition may be treated with:  Medicines to prevent blood clots or to treat heart rate or heart rhythm problems.  Electrical cardioversion to reset the heart's rhythm.  A pacemaker to correct abnormal heart rhythm.  Ablation to remove the heart tissue that sends abnormal signals.  Left atrial appendage closure to seal the area where blood clots can form. In some cases, underlying conditions will be treated. Follow these instructions at home: Medicines  Take over-the counter and prescription medicines only as told by your health care provider.  Do not take any new medicines without talking to your health care provider.  If you are taking blood thinners: ? Talk with your health care provider before you take any medicines that contain aspirin or NSAIDs, such as ibuprofen. These medicines increase your risk for dangerous bleeding. ? Take your medicine exactly as told, at the same time every day. ? Avoid activities  that could cause injury or bruising, and follow instructions about how to prevent falls. ? Wear a medical alert bracelet or carry a card that lists what medicines you take. Lifestyle  Do not use any products that contain nicotine or tobacco, such as  cigarettes, e-cigarettes, and chewing tobacco. If you need help quitting, ask your health care provider.  Eat heart-healthy foods. Talk with a dietitian to make an eating plan that is right for you.  Exercise regularly as told by your health care provider.  Do not drink alcohol.  Lose weight if you are overweight.  Do not use drugs, including cannabis.      General instructions  If you have obstructive sleep apnea, manage your condition as told by your health care provider.  Do not use diet pills unless your health care provider approves. Diet pills can make heart problems worse.  Keep all follow-up visits as told by your health care provider. This is important. Contact a health care provider if you:  Notice a change in the rate, rhythm, or strength of your heartbeat.  Are taking a blood thinner and you notice more bruising.  Tire more easily when you exercise or do heavy work.  Have a sudden change in weight. Get help right away if you have:  Chest pain, abdominal pain, sweating, or weakness.  Trouble breathing.  Side effects of blood thinners, such as blood in your vomit, stool, or urine, or bleeding that cannot stop.  Any symptoms of a stroke. "BE FAST" is an easy way to remember the main warning signs of a stroke: ? B - Balance. Signs are dizziness, sudden trouble walking, or loss of balance. ? E - Eyes. Signs are trouble seeing or a sudden change in vision. ? F - Face. Signs are sudden weakness or numbness of the face, or the face or eyelid drooping on one side. ? A - Arms. Signs are weakness or numbness in an arm. This happens suddenly and usually on one side of the body. ? S - Speech. Signs are sudden trouble speaking, slurred speech, or trouble understanding what people say. ? T - Time. Time to call emergency services. Write down what time symptoms started.  Other signs of a stroke, such as: ? A sudden, severe headache with no known cause. ? Nausea or  vomiting. ? Seizure. These symptoms may represent a serious problem that is an emergency. Do not wait to see if the symptoms will go away. Get medical help right away. Call your local emergency services (911 in the [place] Do not drive yourself to the hospital.   Summary  Atrial fibrillation is a type of irregular or rapid heartbeat (arrhythmia).  Symptoms include a feeling that your heart is beating fast or irregularly.  You may be given medicines to prevent blood clots or to treat heart rate or heart rhythm problems.  Get help right away if you have signs or symptoms of a stroke.  Get help right away if you cannot catch your breath or have chest pain or pressure. This information is not intended to replace advice given to you by your health care provider. Make sure you discuss any questions you have with your health care provider. Document Revised: 02/14/2019 Document Reviewed: 02/14/2019 Elsevier Patient Education  2021 [employer].

## 2020-11-14 ENCOUNTER — Encounter: Payer: Self-pay | Admitting: *Deleted

## 2020-11-14 ENCOUNTER — Telehealth: Payer: Self-pay

## 2020-11-14 ENCOUNTER — Encounter: Payer: Self-pay | Admitting: Family Medicine

## 2020-11-14 DIAGNOSIS — I4821 Permanent atrial fibrillation: Secondary | ICD-10-CM

## 2020-11-14 DIAGNOSIS — I4891 Unspecified atrial fibrillation: Secondary | ICD-10-CM | POA: Insufficient documentation

## 2020-11-14 HISTORY — DX: Permanent atrial fibrillation: I48.21

## 2020-11-14 LAB — CBC
Hematocrit: 44.4 % (ref 34.0–46.6)
Hemoglobin: 14.9 g/dL (ref 11.1–15.9)
MCH: 31.4 pg (ref 26.6–33.0)
MCHC: 33.6 g/dL (ref 31.5–35.7)
MCV: 94 fL (ref 79–97)
Platelets: 287 10*3/uL (ref 150–450)
RBC: 4.75 x10E6/uL (ref 3.77–5.28)
RDW: 13.2 % (ref 11.7–15.4)
WBC: 6.3 10*3/uL (ref 3.4–10.8)

## 2020-11-14 LAB — CMP14+EGFR
ALT: 21 IU/L (ref 0–32)
AST: 27 IU/L (ref 0–40)
Albumin/Globulin Ratio: 1.5 (ref 1.2–2.2)
Albumin: 4.3 g/dL (ref 3.6–4.6)
Alkaline Phosphatase: 110 IU/L (ref 44–121)
BUN/Creatinine Ratio: 12 (ref 12–28)
BUN: 11 mg/dL (ref 8–27)
Bilirubin Total: 1.4 mg/dL — ABNORMAL HIGH (ref 0.0–1.2)
CO2: 25 mmol/L (ref 20–29)
Calcium: 9.7 mg/dL (ref 8.7–10.3)
Chloride: 99 mmol/L (ref 96–106)
Creatinine, Ser: 0.92 mg/dL (ref 0.57–1.00)
Globulin, Total: 2.8 g/dL (ref 1.5–4.5)
Glucose: 87 mg/dL (ref 65–99)
Potassium: 4.5 mmol/L (ref 3.5–5.2)
Sodium: 140 mmol/L (ref 134–144)
Total Protein: 7.1 g/dL (ref 6.0–8.5)
eGFR: 61 mL/min/{1.73_m2} (ref >59)

## 2020-11-14 LAB — TSH: TSH: 1.48 u[IU]/mL (ref 0.450–4.500)

## 2020-11-14 NOTE — Telephone Encounter (Signed)
Spoke with pt's daughter per request of Dr Elease Hashimoto to verify pt is aware of appointment scheduled for 11/17/2020 at 10am.  Daughter states pt is aware and thanked Charity fundraiser for the call.         Nahser, Deloris Ping, MD  Terrilyn Saver, RN; P C[facility] Triage  Please set up a new patient evaluation for Gerica Koble as my last morning patient next Monday , March 14 at 10:00 AM   She has new onset atrial fib  She has already been started on eliquis by her primaay MD   Please call her daughter, Emiliano Dyer to arrange the appt.  [phone]

## 2020-11-14 NOTE — Assessment & Plan Note (Signed)
Established problem  Daily famotidine stopped without flare of GERD. Sharon Dodson is taking TUMS for her occasional indigestion.

## 2020-11-14 NOTE — Assessment & Plan Note (Signed)
New Onset Atrial Fibrillation - Unknow date of onset Possible increase of DOE but only in hindsight.  No chest pain, rest SHOB or orthopnea.  No complaints concerning for CVA or systemic emboli.   EKG (12 lead)  Atrial Fibrillation with VR 83 bpm Poor R-wave progression  CHADS-Vasc = 6 (HTN, Age, Prior stroke, female) ==> ~10% risk CVA/Systemic emboli event per year.   A/P Atrial Fibrillation (new onset) - asymptomatic with normal rate - uncertain onset date Incidental finding on scheduled med check up visit.  Family requests referral to Dr Elease Hashimoto [facility]) for evaluation and treatment. Referral made to Dr Elease Hashimoto.   High Risk embolic event - Started Apixaban per pharmacy recommendation.

## 2020-11-14 NOTE — Assessment & Plan Note (Signed)
Established problem. Stable. No signs of complications, medication side effects, or red flags. Tolerating stopping the duloxetine.  No new sadness or loss of pleasure. Tolerating the bid memantine 10 mg

## 2020-11-14 NOTE — Progress Notes (Signed)
Asked by Dr. McDiarmid to counsel the patient on apixaban for new onset atrial fibrillation.  Patient educated on purpose, proper use and potential adverse effects of bleeding.  Following instruction patient verbalized understanding of treatment plan.   Medication Samples have been provided to the patient.  Drug name: Apixaban       Strength: 5 mg        Qty: 42 tablets LOT: [id number] Exp.Date: 01/03/2022  Dosing instructions: Take one (1) tablet by mouth twice daily  The patient has been instructed regarding the correct time, dose, and frequency of taking this medication, including desired effects and most common side effects. Patient verbalized understanding.  Sanda Klein, PharmD, RPh  PGY-1 Pharmacy Resident 11/14/2020 7:58 AM

## 2020-11-16 ENCOUNTER — Encounter: Payer: Self-pay | Admitting: Cardiovascular Disease

## 2020-11-16 NOTE — Progress Notes (Unsigned)
Cardiology Office Note:    Date:  11/17/2020   ID:  Sharon Dodson, DOB 01-03-1932, MRN [id number]  PCP:  McDiarmid, Leighton Roach, MD   [facility]  Cardiologist:  Nahser  Advanced Practice Provider:  No care team member to display Electrophysiologist:  None    Referring MD: McDiarmid, Leighton Roach, MD   Chief Complaint  Patient presents with  . Atrial Fibrillation   November 17, 2020   Sharon Dodson is a 87 y.o. female with a hx of HTN who was recently found to have atrial fib.   We were asked to see her for further eval of her atrial fib by Dr. Perley Jain. Seen with Emiliano Dyer ( daughter ) today   Hx of stroke in the past  - was started on Plavix Hx of HLD  Hx of HTN   Gets out of breath walking down to the eating area .  No exercise  No CP , no syncope   Mild - moderate dementia Most questions were answered by [name] ,   Past Medical History:  Diagnosis Date  . Allergic rhinitis 07/02/2014  . Anemia 07/02/2014  . Ankle edema, bilateral 05/02/2020   Likely venous insufficiency  . Benign essential HTN 05/01/2020  . Decreased range of motion of left shoulder 05/02/2020  . Environmental and seasonal allergies 01/18/2020  . H/O reduction of closed dislocation 12/06/2012  . History of cerebral infarction 05/01/2020   Formatting of this note might be different from the original. 07/2010, RIGHT parietal lobe, without residual effects  . Nocturia 05/01/2020  . Pure hypercholesterolemia 05/01/2020  . RCT (rotator cuff tear) 12/21/2012  . Shoulder dislocation 10/16/2012  . Uncomplicated asthma 05/01/2020  . Unsteady gait 07/02/2014  . Urgency incontinence 07/02/2014  . Venous insufficiency 07/02/2014  . Venous stasis dermatitis of right lower extremity 06/03/2020    History reviewed. No pertinent surgical history.  Current Medications: Current Meds  Medication Sig  . calcium-vitamin D (OSCAL 500/200 D-3) 500-200 MG-UNIT tablet Take 2 tablets by mouth daily with  breakfast.  . Cyanocobalamin (VITAMIN B-12 PO) Take 1 tablet by mouth daily.   Marland Kitchen desloratadine (CLARINEX) 5 MG tablet Take 5 mg by mouth daily.   . memantine (NAMENDA) 10 MG tablet Take 1 tablet (10 mg total) by mouth 2 (two) times daily.  . Multiple Vitamins-Minerals (PRESERVISION AREDS 2) CAPS Take 1 capsule by mouth daily.  Marland Kitchen olmesartan (BENICAR) 20 MG tablet Take 1 tablet by mouth daily.  . pravastatin (PRAVACHOL) 80 MG tablet Take 1 tablet by mouth daily.  Marland Kitchen triamcinolone ointment (KENALOG) 0.1 % Apply 1 application topically 2 (two) times daily.  . [DISCONTINUED] apixaban (ELIQUIS) 5 MG TABS tablet Take 5 mg by mouth 2 (two) times daily.  . [DISCONTINUED] clopidogrel (PLAVIX) 75 MG tablet Take 1 tablet by mouth daily.     Allergies:   Sulfa antibiotics, Sulfasalazine, and Hydrocodone   Social History   Socioeconomic History  . Marital status: Married    Spouse name: Not on file  . Number of children: Not on file  . Years of education: >65   . Highest education level: Master's degree (e.g., MA, MS, MEng, MEd, MSW, MBA)  Occupational History  . Occupation: Doctor, general practice    Comment: retired  Tobacco Use  . Smoking status: Never Smoker  . Smokeless tobacco: Never Used  Substance and Sexual Activity  . Alcohol use: Not on file  . Drug use: Not on file  . Sexual activity: Not  on file  Other Topics Concern  . Not on file  Social History Narrative  . Not on file   Social Determinants of Health   Financial Resource Strain: Not on file  Food Insecurity: Not on file  Transportation Needs: Not on file  Physical Activity: Not on file  Stress: Not on file  Social Connections: Not on file     Family History: The patient's She was adopted. Family history is unknown by patient.  ROS:   Please see the history of present illness.     All other systems reviewed and are negative.  EKGs/Labs/Other Studies Reviewed:    The following studies were reviewed  today:    Recent Labs: 11/13/2020: ALT 21; BUN 11; Creatinine, Ser 0.92; Hemoglobin 14.9; Platelets 287; Potassium 4.5; Sodium 140; TSH 1.480  Recent Lipid Panel    Component Value Date/Time   CHOL 158 07/10/2020 1203   TRIG 71 07/10/2020 1203   HDL 71 07/10/2020 1203   CHOLHDL 2.2 07/10/2020 1203   LDLCALC 73 07/10/2020 1203     Risk Assessment/Calculations:    CHA2DS2-VASc Score = 6  This indicates a 9.7% annual risk of stroke. The patient's score is based upon: CHF History: No HTN History: Yes Diabetes History: No Stroke History: Yes Vascular Disease History: No Age Score: 2 Gender Score: 1     Physical Exam:    VS:  BP 100/70 (BP Location: Left Arm, Patient Position: Sitting, Cuff Size: Normal)   Pulse 77   Ht 5' (1.524 m)   Wt 165 lb (74.8 kg)   SpO2 98%   BMI 32.22 kg/m     Wt Readings from Last 3 Encounters:  11/17/20 165 lb (74.8 kg)  11/13/20 163 lb 8 oz (74.2 kg)  10/02/20 167 lb (75.8 kg)     GEN:   Elderly female,  NAD  HEENT: Normal NECK: No JVD; No carotid bruits LYMPHATICS: No lymphadenopathy CARDIAC: irreg. Irreg.  RESPIRATORY:  Clear to auscultation without rales, wheezing or rhonchi  ABDOMEN: Soft, non-tender, non-distended MUSCULOSKELETAL:  No edema; No deformity  SKIN: Warm and dry NEUROLOGIC:  Alert and oriented x 3 PSYCHIATRIC:  Normal affect    EKG:   November 17, 2020:   Atrial fib with controlled V response    ASSESSMENT:    1. Atrial fibrillation, unspecified type (HCC)    PLAN:      1.  Atrial fib :  CHADS2VASC of 6.  Newly diagnosed. HR is well controlled.  She has been taking samples of eliquis,  Will write for eliqus - [facility]  TSH is normal  Discussed ETOH  moderation  She is completely asymptomatic  Echo   2.  HTN:   BP is well controlled.     Medication Adjustments/Labs and Tests Ordered: Current medicines are reviewed at length with the patient today.  Concerns regarding medicines are  outlined above.  Orders Placed This Encounter  Procedures  . CBC  . Basic metabolic panel  . EKG 12-Lead  . ECHOCARDIOGRAM COMPLETE   Meds ordered this encounter  Medications  . apixaban (ELIQUIS) 5 MG TABS tablet    Sig: Take 1 tablet (5 mg total) by mouth 2 (two) times daily.    Dispense:  180 tablet    Refill:  3    Patient Instructions  Medication Instructions:  Your physician has recommended you make the following change in your medication:   STOP Plavix  START Eliquis 5mg  twice a day   *  If you need a refill on your cardiac medications before your next appointment, please call your pharmacy*   Lab Work: Your physician recommends that you return for lab work in: 1 month.      Testing/Procedures: Your physician has requested that you have an echocardiogram. Echocardiography is a painless test that uses sound waves to create images of your heart. It provides your doctor with information about the size and shape of your heart and how well your heart's chambers and valves are working. This procedure takes approximately one hour. There are no restrictions for this procedure.     Follow-Up: At [facility], you and your health needs are our priority.  As part of our continuing mission to provide you with exceptional heart care, we have created designated Provider Care Teams.  These Care Teams include your primary Cardiologist (physician) and Advanced Practice Providers (APPs -  Physician Assistants and Nurse Practitioners) who all work together to provide you with the care you need, when you need it.  Your next appointment:   6 month(s)  The format for your next appointment:   In Person  Provider:   You may see Dr. Elease Hashimoto or one of the following Advanced Practice Providers on your designated Care Team:    Tereso Newcomer, PA-C  Chelsea Aus, [place]      Signed, Kristeen Miss, MD  11/17/2020 11:07 AM    [facility]

## 2020-11-17 ENCOUNTER — Encounter: Payer: Self-pay | Admitting: Cardiovascular Disease

## 2020-11-17 ENCOUNTER — Ambulatory Visit (INDEPENDENT_AMBULATORY_CARE_PROVIDER_SITE_OTHER): Payer: Medicare Other | Admitting: Cardiovascular Disease

## 2020-11-17 ENCOUNTER — Other Ambulatory Visit: Payer: Self-pay

## 2020-11-17 VITALS — BP 100/70 | HR 77 | Ht 60.0 in | Wt 165.0 lb

## 2020-11-17 DIAGNOSIS — I4891 Unspecified atrial fibrillation: Secondary | ICD-10-CM | POA: Diagnosis not present

## 2020-11-17 DIAGNOSIS — I10 Essential (primary) hypertension: Secondary | ICD-10-CM

## 2020-11-17 MED ORDER — APIXABAN 5 MG PO TABS: 5.0000 mg | ORAL_TABLET | Freq: Two times a day (BID) | ORAL | 3 refills | Status: DC

## 2020-11-17 NOTE — Patient Instructions (Addendum)
Medication Instructions:  Your physician has recommended you make the following change in your medication:   STOP Plavix  START Eliquis 5mg  twice a day   *If you need a refill on your cardiac medications before your next appointment, please call your pharmacy*   Lab Work: CBC and BMET Your physician recommends that you return for lab work in: 1 month.      Testing/Procedures: Your physician has requested that you have an echocardiogram. Echocardiography is a painless test that uses sound waves to create images of your heart. It provides your doctor with information about the size and shape of your heart and how well your heart's chambers and valves are working. This procedure takes approximately one hour. There are no restrictions for this procedure.     Follow-Up: At [facility], you and your health needs are our priority.  As part of our continuing mission to provide you with exceptional heart care, we have created designated Provider Care Teams.  These Care Teams include your primary Cardiologist (physician) and Advanced Practice Providers (APPs -  Physician Assistants and Nurse Practitioners) who all work together to provide you with the care you need, when you need it.  Your next appointment:   6 month(s)  The format for your next appointment:   In Person  Provider:   You may see Dr. [facility] or one of the following Advanced Practice Providers on your designated Care Team:    Elease Hashimoto, PA-C  [place], [place]

## 2020-12-15 ENCOUNTER — Other Ambulatory Visit: Payer: Self-pay

## 2020-12-15 ENCOUNTER — Ambulatory Visit (HOSPITAL_COMMUNITY): Payer: Medicare Other | Attending: Cardiology

## 2020-12-15 DIAGNOSIS — I4891 Unspecified atrial fibrillation: Secondary | ICD-10-CM | POA: Diagnosis present

## 2020-12-15 LAB — ECHOCARDIOGRAM COMPLETE
AR max vel: 0.9 cm2
AV Area VTI: 0.88 cm2
AV Area mean vel: 0.91 cm2
AV Mean grad: 13 mmHg
AV Peak grad: 18.6 mmHg
Ao pk vel: 2.16 m/s
Area-P 1/2: 2.95 cm2
S' Lateral: 3.2 cm

## 2021-01-08 ENCOUNTER — Other Ambulatory Visit: Payer: Self-pay

## 2021-01-08 ENCOUNTER — Ambulatory Visit (INDEPENDENT_AMBULATORY_CARE_PROVIDER_SITE_OTHER): Payer: Medicare Other | Admitting: Family Medicine

## 2021-01-08 ENCOUNTER — Ambulatory Visit (INDEPENDENT_AMBULATORY_CARE_PROVIDER_SITE_OTHER): Payer: Medicare Other

## 2021-01-08 ENCOUNTER — Encounter: Payer: Self-pay | Admitting: Family Medicine

## 2021-01-08 VITALS — BP 130/85 | HR 57 | Ht 64.0 in | Wt 164.8 lb

## 2021-01-08 DIAGNOSIS — M21611 Bunion of right foot: Secondary | ICD-10-CM | POA: Diagnosis not present

## 2021-01-08 DIAGNOSIS — Z23 Encounter for immunization: Secondary | ICD-10-CM | POA: Diagnosis not present

## 2021-01-08 DIAGNOSIS — I4821 Permanent atrial fibrillation: Secondary | ICD-10-CM

## 2021-01-08 DIAGNOSIS — J309 Allergic rhinitis, unspecified: Secondary | ICD-10-CM

## 2021-01-08 DIAGNOSIS — I517 Cardiomegaly: Secondary | ICD-10-CM

## 2021-01-08 DIAGNOSIS — Z8673 Personal history of transient ischemic attack (TIA), and cerebral infarction without residual deficits: Secondary | ICD-10-CM | POA: Diagnosis not present

## 2021-01-08 DIAGNOSIS — E7800 Pure hypercholesterolemia, unspecified: Secondary | ICD-10-CM

## 2021-01-08 DIAGNOSIS — M2012 Hallux valgus (acquired), left foot: Secondary | ICD-10-CM

## 2021-01-08 DIAGNOSIS — G301 Alzheimer's disease with late onset: Secondary | ICD-10-CM

## 2021-01-08 DIAGNOSIS — M21612 Bunion of left foot: Secondary | ICD-10-CM

## 2021-01-08 DIAGNOSIS — I10 Essential (primary) hypertension: Secondary | ICD-10-CM

## 2021-01-08 DIAGNOSIS — R269 Unspecified abnormalities of gait and mobility: Secondary | ICD-10-CM

## 2021-01-08 HISTORY — DX: Cardiomegaly: I51.7

## 2021-01-08 MED ORDER — LORATADINE 10 MG PO TABS: 10.0000 mg | ORAL_TABLET | Freq: Every day | ORAL | 3 refills | Status: DC

## 2021-01-08 MED ORDER — PRAVASTATIN SODIUM 80 MG PO TABS: 80.0000 mg | ORAL_TABLET | Freq: Every day | ORAL | 3 refills | Status: DC

## 2021-01-08 NOTE — Patient Instructions (Signed)
Your blood pressure and weight are good.   Please go see the podiatrist to look after your toes.   Please go to the Physical Therapist to work on your walking and straighten your back.  I believe you have blepharitis of your eye lids.  Wiping them twice a day with baby shampoo will help with this.

## 2021-01-09 DIAGNOSIS — M21619 Bunion of unspecified foot: Secondary | ICD-10-CM

## 2021-01-09 DIAGNOSIS — M2010 Hallux valgus (acquired), unspecified foot: Secondary | ICD-10-CM

## 2021-01-09 DIAGNOSIS — R269 Unspecified abnormalities of gait and mobility: Secondary | ICD-10-CM | POA: Insufficient documentation

## 2021-01-09 HISTORY — DX: Hallux valgus (acquired), unspecified foot: M20.10

## 2021-01-09 HISTORY — DX: Unspecified abnormalities of gait and mobility: R26.9

## 2021-01-09 HISTORY — DX: Bunion of unspecified foot: M21.619

## 2021-01-09 NOTE — Assessment & Plan Note (Signed)
Established problem Remains in mild stage No behavioral issues raised.  Continue to monitor physical and cognitive function periodically

## 2021-01-09 NOTE — Assessment & Plan Note (Signed)
Established problem Well Controlled. No signs of complications, medication side effects, or red flags. Continue current medications and other regiments.  

## 2021-01-09 NOTE — Progress Notes (Signed)
Sharon Dodson is accompanied by daughter and husband Sources of clinical information for visit is/are patient, spouse/SO and relative(s). Nursing assessment for this office visit was reviewed with the patient for accuracy and revision.     Previous Report(s) Reviewed: office notes  Depression screen [facility] 2/9 01/08/2021  Decreased Interest 0  Down, Depressed, Hopeless 0  PHQ - 2 Score 0  Altered sleeping 0  Tired, decreased energy 0  Change in appetite 0  Feeling bad or failure about yourself  0  Trouble concentrating 0  Moving slowly or fidgety/restless 0  Suicidal thoughts 0  PHQ-9 Score 0  Difficult doing work/chores Not difficult at all    Fall Risk  01/08/2021 11/13/2020 10/02/2020 07/10/2020 05/29/2020  Falls in the past year? 0 0 0 0 0  Number falls in past yr: 0 0 0 - -  Injury with Fall? 0 0 0 - -  Risk for fall due to : - - Impaired balance/gait - -    PHQ9 SCORE ONLY 01/08/2021 11/13/2020 10/02/2020  PHQ-9 Total Score 0 0 0    Adult vaccines due  Topic Date Due  . TETANUS/TDAP  03/12/2016    Health Maintenance Due  Topic Date Due  . DEXA SCAN  Never done  . TETANUS/TDAP  03/12/2016      History/P.E. limitations: dementia, mild  Adult vaccines due  Topic Date Due  . TETANUS/TDAP  03/12/2016   There are no preventive care reminders to display for this patient.  Health Maintenance Due  Topic Date Due  . DEXA SCAN  Never done  . TETANUS/TDAP  03/12/2016     Chief Complaint  Patient presents with  . Follow-up

## 2021-01-09 NOTE — Assessment & Plan Note (Addendum)
New problem Bilateral Great toe valgus deviation without toe overlapping Bilateral medial Great toe callus formation with thick adherent crust Onychauxis present  Referral to [facility] for evaluation and treatment

## 2021-01-09 NOTE — Assessment & Plan Note (Signed)
Established problem. Adequate blood pressure control.  No evidence of new end organ damage.  Tolerating medication without significant adverse effects.  Plan to continue current blood pressure medication regiment.   

## 2021-01-09 NOTE — Assessment & Plan Note (Signed)
Recurrent problem Patient with forward flexion at hips with ambulation. Center of gravity off center. At risk for fall. Plan Referral for outpatient Physical Therapy evaluation and treatment

## 2021-01-09 NOTE — Assessment & Plan Note (Signed)
Established problem Well Controlled. No signs of complications, medication side effects, or red flags. Change Clarinex to [employer] for insurance reasons

## 2021-01-09 NOTE — Assessment & Plan Note (Addendum)
Established problem Well Controlled. Lab Results  Component Value Date   CHOL 158 07/10/2020   HDL 71 07/10/2020   LDLCALC 73 07/10/2020   TRIG 71 07/10/2020   CHOLHDL 2.2 07/10/2020  Continue Pravastatin 80 mg at bedtime

## 2021-03-13 ENCOUNTER — Ambulatory Visit
Admission: RE | Admit: 2021-03-13 | Discharge: 2021-03-13 | Disposition: A | Discharge status: Home / Self Care | Payer: Medicare Other | Source: Ambulatory Visit | Attending: Family Medicine | Admitting: Family Medicine

## 2021-03-13 ENCOUNTER — Other Ambulatory Visit: Payer: Self-pay

## 2021-03-13 DIAGNOSIS — Z13820 Encounter for screening for osteoporosis: Secondary | ICD-10-CM

## 2021-03-13 DIAGNOSIS — Z780 Asymptomatic menopausal state: Secondary | ICD-10-CM

## 2021-03-17 ENCOUNTER — Encounter: Payer: Self-pay | Admitting: Family Medicine

## 2021-03-17 ENCOUNTER — Telehealth: Payer: Self-pay | Admitting: Family Medicine

## 2021-03-17 DIAGNOSIS — M6250 Muscle wasting and atrophy, not elsewhere classified, unspecified site: Secondary | ICD-10-CM

## 2021-03-17 DIAGNOSIS — R296 Repeated falls: Secondary | ICD-10-CM

## 2021-03-17 DIAGNOSIS — M810 Age-related osteoporosis without current pathological fracture: Secondary | ICD-10-CM

## 2021-03-17 DIAGNOSIS — Z7409 Other reduced mobility: Secondary | ICD-10-CM

## 2021-03-17 MED ORDER — ALENDRONATE SODIUM 70 MG PO TABS: 70.0000 mg | ORAL_TABLET | ORAL | 3 refills | Status: DC

## 2021-03-17 NOTE — Telephone Encounter (Signed)
DEXA 03/2021:  The probability of a hip fracture is 5.1% within the next ten years. The probability of a major osteoporotic fracture is 15.1% within the next ten years.

## 2021-03-17 NOTE — Telephone Encounter (Signed)
I spoke with Sharon Sharon Dodson, [facility] and daughter of Sharon Dodson.  Sharon Dodson has experienced two more falls since she was last seen at the [facility]. She is not able to get out of the chair unless assisted now.   Sharon Dodson is hiring 1.5 hours PCS a couple times a week to help with care of her mother and father at their independent living home on their [place].   We discussed the onset of bisphosphonates action in decreasing risk of osteoportic fractures. We discussed her mother's increase risk of hip fracture based on her age and BMD FRAX score (5% in 10 yr). We discussed the particular instructions in administration of alendronate to prevent GI disorders.   Sharon Dodson agreed to start of alendronate.   A/ Osteoporosis - High risk hip fracture based on recent DEXA BMD Falls Immobility Muscle disuse atrophy  P/ Start alendronate 70 mg by mouth weekly Referral HH Physical Therapy - I have seen patient in last 90 days.   Patient has appointment with me in mid-August.

## 2021-03-17 NOTE — Assessment & Plan Note (Signed)
DEXA 03/2021:  The probability of a hip fracture is 5.1% within the next ten years. The probability of a major osteoporotic fracture is 15.1% within the next ten years.    

## 2021-03-23 ENCOUNTER — Telehealth: Payer: Self-pay

## 2021-03-23 NOTE — Telephone Encounter (Signed)
Tyler from [facility] calling for PT verbal orders as follows:  1 time(s) weekly for 8 week(s).   Also requesting verbal orders for OT evaluation.   Verbal orders given per [facility] protocol  Veronda Prude, RN

## 2021-03-24 NOTE — Telephone Encounter (Signed)
Reviewed and agree.

## 2021-04-08 ENCOUNTER — Telehealth: Payer: Self-pay | Admitting: *Deleted

## 2021-04-08 NOTE — Telephone Encounter (Signed)
Appt made and daughter is aware.  Alpa Salvo,CMA

## 2021-04-08 NOTE — Telephone Encounter (Signed)
Jazmin -  Could you schedule me a video visit with Mrs Trager 8/8 at 9:15? Tawanna Cooler

## 2021-04-08 NOTE — Telephone Encounter (Signed)
Received voicemail from daughter stating that mother is scheduled to see PCP On 04-23-21 but she is needing a F2F visit by 04/22/21 in order for PT to continue her treatments.  They were able to start her care based on the visit from May but there wasn't mention of her specific need for [facility] PT and being homebound.  Provider is in clinic tomorrow but is full.  Will see what he suggests.  I did speak with Jeanice Lim at [place] [phone]) to clarify and she said that it can even be an audio/visual visit if provider is agreeable.  Will forward to MD. Burnard Hawthorne

## 2021-04-13 ENCOUNTER — Other Ambulatory Visit: Payer: Self-pay

## 2021-04-13 ENCOUNTER — Telehealth (INDEPENDENT_AMBULATORY_CARE_PROVIDER_SITE_OTHER): Payer: Medicare Other | Admitting: Family Medicine

## 2021-04-13 ENCOUNTER — Encounter: Payer: Self-pay | Admitting: Family Medicine

## 2021-04-13 DIAGNOSIS — W19XXXA Unspecified fall, initial encounter: Secondary | ICD-10-CM

## 2021-04-13 DIAGNOSIS — R296 Repeated falls: Secondary | ICD-10-CM

## 2021-04-13 DIAGNOSIS — Z7409 Other reduced mobility: Secondary | ICD-10-CM

## 2021-04-13 HISTORY — DX: Repeated falls: R29.6

## 2021-04-13 HISTORY — DX: Unspecified fall, initial encounter: W19.XXXA

## 2021-04-13 NOTE — Progress Notes (Signed)
[facility] Video Visit  This visit type was conducted due to national recommendations for restrictions regarding the COVID-19 Pandemic (e.g. social distancing) in an effort to limit this patient's exposure and mitigate transmission in our community.   Due to their co-morbid illnesses, this patient is at least at moderate risk for complications without adequate follow up: yes   This format is felt to be most appropriate for this patient at this time.  All issues noted in this document were discussed and addressed.  A limited physical exam was performed with this format.   Patient's identity was confirmed by visually by interviewer.   Encounter participants: Patient: Sharon Dodson  Patient Location: Other:  Car Provider: Tawanna Cooler Akacia Boltz at office  Others (if applicable): daughter [name] and husband Mr Rautio.  Information gathered from patient, daughter, and office notes Chief Complaint: Falls and general weakness  HPI: Fall - Tripped over furniture, wedged between furniture, unable to pull self up, called her SIL who soon was able to get her up, no complaint of injury.   - Mrs Arth feels unbalanced with standing and walking.  She has a walker that she uses much of the time in her apartment.  - [name] is concerned that while motivated to HEP, her memory impairment interferes with performing them.  - Patient unable to pull self into bed without assistance from husband, unable to rise from chair without assistance, needs assistance getting into care, and climbing steps.   She needs close supervision for safety when going down steps. - Patient has been working with home health Physical Therpist, Joselyn Glassman, from [facility] agency.  She has been an active participant with the therapies.   - Mrs Puett is taking her alendronate for primary osteoporosis weekly without symptoms of intolerance.    Exam:  Respiratory: speaking in full sentence, no audible  wheeze  Assessment/Plan:  No problem-specific Assessment & Plan notes found for this encounter.    Follow Up Instructions:     We discussed the assessment and treatment plan with the patient. The patient was provided an opportunity to ask questions and all were answered. The patient agreed with the plan and demonstrated an understanding of the instructions.   The patient was advised to call back or seek an in-person evaluation if the symptoms worsen or if the condition fails to improve as anticipated.  COVID-19 Education: The signs and symptoms of COVID-19 were discussed with the patient and how to seek care for testing (follow up with PCP or arrange E-visit).  The importance of social distancing was discussed today.  Time spent on phone, documentation and referral with patient: 35 minutes

## 2021-04-13 NOTE — Assessment & Plan Note (Signed)
Established problem Inadequately controlled Impairments significantly impairing Sharon Dodson's physical functioning and ability to maintain herself in her current level of independent living.    Plan consultation with [facility] Physical Therapy and Occupational Therapy for evaluation and treatment

## 2021-04-13 NOTE — Assessment & Plan Note (Signed)
Established problem Recurrent falls, falls inadequately controlled  Plan Referral for [facility] Physical Therapy and HH Occupational Therapy evaluation and treatment of both upper and lower proximal limb weakness, imbalance, and generalized muscular weakness.

## 2021-04-13 NOTE — Telephone Encounter (Signed)
Referral and visit notes faxed to [facility] at [phone].  Digna Countess,CMA

## 2021-04-23 ENCOUNTER — Other Ambulatory Visit: Payer: Self-pay

## 2021-04-23 ENCOUNTER — Encounter: Payer: Self-pay | Admitting: Family Medicine

## 2021-04-23 ENCOUNTER — Ambulatory Visit: Payer: Medicare Other | Admitting: Family Medicine

## 2021-04-23 ENCOUNTER — Ambulatory Visit (INDEPENDENT_AMBULATORY_CARE_PROVIDER_SITE_OTHER): Payer: Medicare Other | Admitting: Family Medicine

## 2021-04-23 VITALS — BP 137/99 | HR 75 | Ht 64.0 in | Wt 161.0 lb

## 2021-04-23 DIAGNOSIS — W19XXXD Unspecified fall, subsequent encounter: Secondary | ICD-10-CM

## 2021-04-23 DIAGNOSIS — I4821 Permanent atrial fibrillation: Secondary | ICD-10-CM

## 2021-04-23 DIAGNOSIS — H31109 Choroidal degeneration, unspecified, unspecified eye: Secondary | ICD-10-CM

## 2021-04-23 DIAGNOSIS — F39 Unspecified mood [affective] disorder: Secondary | ICD-10-CM

## 2021-04-23 DIAGNOSIS — I10 Essential (primary) hypertension: Secondary | ICD-10-CM | POA: Diagnosis not present

## 2021-04-23 NOTE — Patient Instructions (Signed)
You are doing well. Keep it up.

## 2021-04-24 ENCOUNTER — Encounter: Payer: Self-pay | Admitting: Family Medicine

## 2021-04-24 NOTE — Progress Notes (Signed)
Sharon Dodson is accompanied by daughter and husband Sources of clinical information for visit is/are patient and relative(s). Nursing assessment for this office visit was reviewed with the patient for accuracy and revision.     Previous Report(s) Reviewed: office notes  Depression screen [facility] 2/9 04/23/2021  Decreased Interest 0  Down, Depressed, Hopeless 0  PHQ - 2 Score 0  Altered sleeping 0  Tired, decreased energy 0  Change in appetite 0  Feeling bad or failure about yourself  0  Trouble concentrating 0  Moving slowly or fidgety/restless 0  Suicidal thoughts 0  PHQ-9 Score 0  Difficult doing work/chores Not difficult at all    Fall Risk  04/23/2021 01/08/2021 11/13/2020 10/02/2020 07/10/2020  Falls in the past year? 1 0 0 0 0  Number falls in past yr: 1 0 0 0 -  Injury with Fall? 1 0 0 0 -  Risk for fall due to : - - - Impaired balance/gait -    PHQ9 SCORE ONLY 04/23/2021 01/08/2021 11/13/2020  PHQ-9 Total Score 0 0 0    Adult vaccines due  Topic Date Due   TETANUS/TDAP  03/12/2016    Health Maintenance Due  Topic Date Due   TETANUS/TDAP  03/12/2016   INFLUENZA VACCINE  04/06/2021      History/P.E. limitations: dementia  Adult vaccines due  Topic Date Due   TETANUS/TDAP  03/12/2016   There are no preventive care reminders to display for this patient.  Health Maintenance Due  Topic Date Due   TETANUS/TDAP  03/12/2016   INFLUENZA VACCINE  04/06/2021     Chief Complaint  Patient presents with   Office visit

## 2021-04-27 ENCOUNTER — Encounter: Payer: Self-pay | Admitting: Family Medicine

## 2021-04-27 DIAGNOSIS — H31109 Choroidal degeneration, unspecified, unspecified eye: Secondary | ICD-10-CM | POA: Insufficient documentation

## 2021-04-27 DIAGNOSIS — F39 Unspecified mood [affective] disorder: Secondary | ICD-10-CM

## 2021-04-27 HISTORY — DX: Unspecified mood (affective) disorder: F39

## 2021-04-27 NOTE — Addendum Note (Signed)
Addended byPerley Jain, Clarrissa Shimkus D on: 04/27/2021 10:06 AM   Modules accepted: Level of Service

## 2021-04-27 NOTE — Assessment & Plan Note (Signed)
Onset of crying daughter restarted duloxetine 20 mg daily Report that crying has stopped.   Plan to continue duloxetine 20 mg daily

## 2021-04-27 NOTE — Assessment & Plan Note (Signed)
Needs evaluation Referral to ophthalmology

## 2021-04-27 NOTE — Assessment & Plan Note (Signed)
Established problem Well Controlled. No signs of complications, medication side effects, or red flags. Continue current medications and other regiments.  

## 2021-04-27 NOTE — Assessment & Plan Note (Signed)
Established problem Well Controlled. Patient walked 200 ft with peak heart rate 110 bpm. Pulse ox RA lowest 95%.  No signs of complications, medication side effects, or red flags. Continue DOAC.  No need for negative chronotrope.

## 2021-04-27 NOTE — Addendum Note (Signed)
Addended byPerley Jain, Bernadetta Roell D on: 04/27/2021 11:04 AM   Modules accepted: Orders

## 2021-04-27 NOTE — Assessment & Plan Note (Addendum)
Established problem No further falls since the one documented in patient's videovisit last week.  Sharon Dodson has restarted [facility] Physical Therapy. [name] reports that she may go to the OP Rehab site for further therapy.

## 2021-05-05 ENCOUNTER — Ambulatory Visit (INDEPENDENT_AMBULATORY_CARE_PROVIDER_SITE_OTHER): Payer: Medicare Other | Admitting: Family Medicine

## 2021-05-05 ENCOUNTER — Other Ambulatory Visit: Payer: Self-pay

## 2021-05-05 VITALS — BP 118/80 | HR 92 | Ht 62.0 in | Wt 160.8 lb

## 2021-05-05 DIAGNOSIS — T161XXA Foreign body in right ear, initial encounter: Secondary | ICD-10-CM | POA: Diagnosis not present

## 2021-05-05 NOTE — Patient Instructions (Addendum)
It was great to meet you!  Today we removed a portion of your hearing aid from the right ear. You have a small cut in that ear from where the hearing aid was stuck. I would suggest only wearing your hearing aid on the left side for a few days. You can resume using the right one in 3-4 days.  Take care and seek immediate care sooner if you develop any concerns.  Dr. [facility]

## 2021-05-05 NOTE — Progress Notes (Signed)
    SUBJECTIVE:   CHIEF COMPLAINT / HPI:   Foreign Body in Ear Patient reports the end piece of her hearing aid is stuck in her R ear. She noticed that part didn't come out 3 days ago when she took her hearing aid out. It's not painful in any way. She tried to remove it with her fingers but stopped because she was worried she may push it in further. This has never happened before. Does not think there is any problem in the left ear.  PERTINENT  PMH / PSH: Alzheimer's, HTN, HLD, GERD, mood disorder  OBJECTIVE:   BP 118/80   Pulse 92   Ht 5\' 2"  (1.575 m)   Wt 160 lb 12.8 oz (72.9 kg)   SpO2 97%   BMI 29.41 kg/m   Ears: Left TM normal, unable to visualize right TM due to foreign body in canal Foreign body (hearing aid cap) removed with forceps. Patient tolerated well without complications. After removal, exam revealed normal TM. Very small abrasion present at 9'oclock position on internal ear canal.  ASSESSMENT/PLAN:   Foreign Body in R Ear Piece of hearing aid stuck in R ear canal x 3 days. It was removed with small forceps without complication. Very small abrasion present on R internal canal after removal, TM normal. Patient advised not to wear her hearing aid on the right for ~3-4 days until the abrasion heals.    , MD [facility]

## 2021-05-17 ENCOUNTER — Encounter: Payer: Self-pay | Admitting: Cardiovascular Disease

## 2021-05-17 NOTE — Progress Notes (Signed)
Cardiology Office Note:    Date:  05/18/2021   ID:  Sharon Dodson, DOB 10-Dec-1931, MRN [id number]  PCP:  McDiarmid, Leighton Roach, MD   Sharon Dodson  Cardiologist:  Sharon Dodson  Advanced Practice Provider:  No care team member to display Electrophysiologist:  None    Referring MD: McDiarmid, Leighton Roach, MD   Chief Complaint  Patient presents with   Atrial Fibrillation   November 17, 2020   Sharon Dodson is a 87 y.o. female with a hx of HTN who was recently found to have atrial fib.   We were asked to see her for further eval of her atrial fib by Dr. Perley Dodson. Seen with Sharon Dodson ( daughter ) today   Hx of stroke in the past  - was started on Plavix Hx of HLD  Hx of HTN   Gets out of breath walking down to the eating area .  No exercise  No CP , no syncope   Mild - moderate dementia Most questions were answered by Sharon Dodson ,  Sept. 12, 2022 Sharon Dodson  is seen for follow up of her Atrial fib.  Has hx of HTN Seen with daughter, Sharon Dodson. Her last covid 19 vaccine was May 5 , 2022.  Has had a bloody nose  - Sharon Dodson had to clean up quite a bit   No dark tarry stools or hematochezia.   Past Medical History:  Diagnosis Date   Allergic rhinitis 07/02/2014   Anemia 07/02/2014   Ankle edema, bilateral 05/02/2020   Likely venous insufficiency   Atrial fibrillation, permanent (HCC) 11/14/2020   Benign essential HTN 05/01/2020   Cerebral infarction (HCC) 07/27/2010   Decreased range of motion of left shoulder 05/02/2020   Environmental and seasonal allergies 01/18/2020   Gait abnormality 01/09/2021   GERD (gastroesophageal reflux disease) 07/27/2010   H/O reduction of closed dislocation 12/06/2012   Hallux abductovalgus with bunions 01/09/2021   History of cerebral infarction 05/01/2020   Formatting of this note might be different from the original. 07/2010, RIGHT parietal lobe, without residual effects   Hypercholesterolemia 07/02/2014   Insomnia 07/02/2014   LVH (left ventricular  hypertrophy) 01/08/2021   Found on transthoracic echocardiogram 11/2020   Nocturia 05/01/2020   Osteoporosis 07/02/2014   Pure hypercholesterolemia 05/01/2020   RCT (rotator cuff tear) 12/21/2012   Shoulder dislocation 10/16/2012   Uncomplicated asthma 05/01/2020   Unsteady gait 07/02/2014   Urgency incontinence 07/02/2014   Venous insufficiency 07/02/2014   Venous stasis dermatitis of right lower extremity 06/03/2020   Ventral hernia, sliding 10/03/2020    History reviewed. No pertinent surgical history.  Current Medications: Current Meds  Medication Sig   alendronate (FOSAMAX) 70 MG tablet Take 1 tablet (70 mg total) by mouth every 7 (seven) days. Take with a full glass of water on an empty stomach.   apixaban (ELIQUIS) 5 MG TABS tablet Take 1 tablet (5 mg total) by mouth 2 (two) times daily.   calcium-vitamin D (OSCAL 500/200 D-3) 500-200 MG-UNIT tablet Take 2 tablets by mouth daily with breakfast.   Cyanocobalamin (VITAMIN B-12 PO) Take 1 tablet by mouth daily.    DULoxetine (CYMBALTA) 20 MG capsule Take 20 mg by mouth daily.   loratadine (CLARITIN) 10 MG tablet Take 1 tablet (10 mg total) by mouth daily.   memantine (NAMENDA) 10 MG tablet Take 1 tablet (10 mg total) by mouth 2 (two) times daily.   Multiple Vitamins-Minerals (PRESERVISION AREDS 2) CAPS Take 1 capsule by mouth daily.  olmesartan (BENICAR) 20 MG tablet Take 1 tablet by mouth daily.   pravastatin (PRAVACHOL) 80 MG tablet Take 1 tablet (80 mg total) by mouth daily.   triamcinolone ointment (KENALOG) 0.1 % Apply 1 application topically 2 (two) times daily.     Allergies:   Sulfa antibiotics, Sulfasalazine, and Hydrocodone   Social History   Socioeconomic History   Marital status: Married    Spouse name: Not on file   Number of children: Not on file   Years of education: >16    Highest education level: Master's degree (e.g., MA, MS, MEng, MEd, MSW, MBA)  Occupational History   Occupation: Doctor, general practice     Comment: retired  Tobacco Use   Smoking status: Never   Smokeless tobacco: Never  Substance and Sexual Activity   Alcohol use: Not on file   Drug use: Not on file   Sexual activity: Not on file  Other Topics Concern   Not on file  Social History Narrative   Not on file   Social Determinants of Health   Financial Resource Strain: Not on file  Food Insecurity: Not on file  Transportation Needs: Not on file  Physical Activity: Not on file  Stress: Not on file  Social Connections: Not on file     Family History: The patient's She was adopted. Family history is unknown by patient.  ROS:   Please see the history of present illness.     All other systems reviewed and are negative.  EKGs/Labs/Other Studies Reviewed:    The following studies were reviewed today:    Recent Labs: 11/13/2020: ALT 21; BUN 11; Creatinine, Ser 0.92; Hemoglobin 14.9; Platelets 287; Potassium 4.5; Sodium 140; TSH 1.480  Recent Lipid Panel    Component Value Date/Time   CHOL 158 07/10/2020 1203   TRIG 71 07/10/2020 1203   HDL 71 07/10/2020 1203   CHOLHDL 2.2 07/10/2020 1203   LDLCALC 73 07/10/2020 1203     Risk Assessment/Calculations:    CHA2DS2-VASc Score = 6  This indicates a 9.7% annual risk of stroke. The patient's score is based upon: CHF History: 0 HTN History: 1 Diabetes History: 0 Stroke History: 2 Vascular Disease History: 0 Age Score: 2 Gender Score: 1     Physical Exam:     Physical Exam: Blood pressure 128/80, pulse 84, height 5\' 2"  (1.575 m), weight 160 lb 12.8 oz (72.9 kg), SpO2 95 %.  GEN:  Well nourished, well developed in no acute distress HEENT: Normal NECK: No JVD; No carotid bruits LYMPHATICS: No lymphadenopathy CARDIAC: RRR,  irreg. Irreg.  RESPIRATORY:  Clear to auscultation without rales, wheezing or rhonchi  ABDOMEN: Soft, non-tender, non-distended MUSCULOSKELETAL:  No edema; No deformity  SKIN: Warm and dry NEUROLOGIC:  Alert and oriented x  3    EKG:      ASSESSMENT:    No diagnosis found.  PLAN:       Atrial fib :  CHADS2VASC of 6.    has persistent atrial fibrillation.  She did have a bloody nose but otherwise has not had any bleeding.  She denies any hematochezia or dark tarry stool. Will continue Eliquis 5 mg twice a day for now.  2.  HTN:      BP is well controlled.     Medication Adjustments/Labs and Tests Ordered: Current medicines are reviewed at length with the patient today.  Concerns regarding medicines are outlined above.  No orders of the defined types were placed in this encounter.  No orders of the defined types were placed in this encounter.   Patient Instructions  Medication Instructions:  Your physician recommends that you continue on your current medications as directed. Please refer to the Current Medication list given to you today.  *If you need a refill on your cardiac medications before your next appointment, please call your pharmacy*   Lab Work: None Ordered If you have labs (blood work) drawn today and your tests are completely normal, you will receive your results only by: MyChart Message (if you have MyChart) OR A paper copy in the mail If you have any lab test that is abnormal or we need to change your treatment, we will call you to review the results.   Testing/Procedures: None Ordered   Follow-Up: At [facility], you and your health needs are our priority.  As part of our continuing mission to provide you with exceptional heart care, we have created designated Provider Care Teams.  These Care Teams include your primary Cardiologist (physician) and Advanced Practice Providers (APPs -  Physician Assistants and Nurse Practitioners) who all work together to provide you with the care you need, when you need it.  Your next appointment:   6 month(s) on Monday March 13  The format for your next appointment:   In Person  Provider:   Kristeen Miss, MD       Signed, Kristeen Miss, MD  05/18/2021 2:30 PM    [facility]

## 2021-05-18 ENCOUNTER — Other Ambulatory Visit: Payer: Self-pay

## 2021-05-18 ENCOUNTER — Ambulatory Visit (INDEPENDENT_AMBULATORY_CARE_PROVIDER_SITE_OTHER): Payer: Medicare Other | Admitting: Cardiovascular Disease

## 2021-05-18 ENCOUNTER — Encounter: Payer: Self-pay | Admitting: Cardiovascular Disease

## 2021-05-18 VITALS — BP 128/80 | HR 84 | Ht 62.0 in | Wt 160.8 lb

## 2021-05-18 DIAGNOSIS — I4821 Permanent atrial fibrillation: Secondary | ICD-10-CM

## 2021-05-18 NOTE — Patient Instructions (Signed)
Medication Instructions:  Your physician recommends that you continue on your current medications as directed. Please refer to the Current Medication list given to you today.  *If you need a refill on your cardiac medications before your next appointment, please call your pharmacy*   Lab Work: None Ordered If you have labs (blood work) drawn today and your tests are completely normal, you will receive your results only by: MyChart Message (if you have MyChart) OR A paper copy in the mail If you have any lab test that is abnormal or we need to change your treatment, we will call you to review the results.   Testing/Procedures: None Ordered   Follow-Up: At [facility], you and your health needs are our priority.  As part of our continuing mission to provide you with exceptional heart care, we have created designated Provider Care Teams.  These Care Teams include your primary Cardiologist (physician) and Advanced Practice Providers (APPs -  Physician Assistants and Nurse Practitioners) who all work together to provide you with the care you need, when you need it.  Your next appointment:   6 month(s) on Monday March 13  The format for your next appointment:   In Person  Provider:   Kristeen Miss, MD

## 2021-05-23 ENCOUNTER — Emergency Department (HOSPITAL_BASED_OUTPATIENT_CLINIC_OR_DEPARTMENT_OTHER): Payer: Medicare Other

## 2021-05-23 ENCOUNTER — Observation Stay (HOSPITAL_BASED_OUTPATIENT_CLINIC_OR_DEPARTMENT_OTHER)
Admission: EM | Admit: 2021-05-23 | Discharge: 2021-05-25 | Disposition: A | Discharge status: Home / Self Care | Payer: Medicare Other | Attending: Family Medicine | Admitting: Family Medicine

## 2021-05-23 ENCOUNTER — Emergency Department (HOSPITAL_BASED_OUTPATIENT_CLINIC_OR_DEPARTMENT_OTHER): Payer: Medicare Other | Admitting: Radiology

## 2021-05-23 ENCOUNTER — Other Ambulatory Visit: Payer: Self-pay

## 2021-05-23 ENCOUNTER — Observation Stay (HOSPITAL_COMMUNITY): Payer: Medicare Other

## 2021-05-23 DIAGNOSIS — G309 Alzheimer's disease, unspecified: Secondary | ICD-10-CM | POA: Insufficient documentation

## 2021-05-23 DIAGNOSIS — Z7901 Long term (current) use of anticoagulants: Secondary | ICD-10-CM | POA: Insufficient documentation

## 2021-05-23 DIAGNOSIS — R296 Repeated falls: Secondary | ICD-10-CM | POA: Insufficient documentation

## 2021-05-23 DIAGNOSIS — R531 Weakness: Secondary | ICD-10-CM | POA: Diagnosis present

## 2021-05-23 DIAGNOSIS — U071 COVID-19: Secondary | ICD-10-CM | POA: Diagnosis not present

## 2021-05-23 DIAGNOSIS — I10 Essential (primary) hypertension: Secondary | ICD-10-CM | POA: Diagnosis not present

## 2021-05-23 DIAGNOSIS — W19XXXA Unspecified fall, initial encounter: Secondary | ICD-10-CM | POA: Diagnosis present

## 2021-05-23 DIAGNOSIS — Z79899 Other long term (current) drug therapy: Secondary | ICD-10-CM | POA: Insufficient documentation

## 2021-05-23 DIAGNOSIS — F0280 Dementia in other diseases classified elsewhere without behavioral disturbance: Secondary | ICD-10-CM | POA: Diagnosis not present

## 2021-05-23 DIAGNOSIS — G8929 Other chronic pain: Secondary | ICD-10-CM | POA: Insufficient documentation

## 2021-05-23 DIAGNOSIS — M79602 Pain in left arm: Secondary | ICD-10-CM | POA: Diagnosis not present

## 2021-05-23 DIAGNOSIS — I4821 Permanent atrial fibrillation: Secondary | ICD-10-CM | POA: Diagnosis not present

## 2021-05-23 DIAGNOSIS — R519 Headache, unspecified: Principal | ICD-10-CM | POA: Insufficient documentation

## 2021-05-23 HISTORY — DX: Unspecified fall, initial encounter: W19.XXXA

## 2021-05-23 HISTORY — DX: COVID-19: U07.1

## 2021-05-23 LAB — RESP PANEL BY RT-PCR (FLU A&B, COVID) ARPGX2
Influenza A by PCR: NEGATIVE
Influenza B by PCR: NEGATIVE
SARS Coronavirus 2 by RT PCR: POSITIVE — AB

## 2021-05-23 LAB — CBC WITH DIFFERENTIAL/PLATELET
Abs Immature Granulocytes: 0 10*3/uL (ref 0.00–0.07)
Basophils Absolute: 0 10*3/uL (ref 0.0–0.1)
Basophils Relative: 1 %
Eosinophils Absolute: 0 10*3/uL (ref 0.0–0.5)
Eosinophils Relative: 0 %
HCT: 41.2 % (ref 36.0–46.0)
Hemoglobin: 14 g/dL (ref 12.0–15.0)
Immature Granulocytes: 0 %
Lymphocytes Relative: 21 %
Lymphs Abs: 0.9 10*3/uL (ref 0.7–4.0)
MCH: 32.4 pg (ref 26.0–34.0)
MCHC: 34 g/dL (ref 30.0–36.0)
MCV: 95.4 fL (ref 80.0–100.0)
Monocytes Absolute: 0.7 10*3/uL (ref 0.1–1.0)
Monocytes Relative: 16 %
Neutro Abs: 2.7 10*3/uL (ref 1.7–7.7)
Neutrophils Relative %: 62 %
Platelets: 184 10*3/uL (ref 150–400)
RBC: 4.32 MIL/uL (ref 3.87–5.11)
RDW: 13.2 % (ref 11.5–15.5)
WBC: 4.2 10*3/uL (ref 4.0–10.5)
nRBC: 0 % (ref 0.0–0.2)

## 2021-05-23 LAB — COMPREHENSIVE METABOLIC PANEL
ALT: 17 U/L (ref 0–44)
AST: 25 U/L (ref 15–41)
Albumin: 3.8 g/dL (ref 3.5–5.0)
Alkaline Phosphatase: 80 U/L (ref 38–126)
Anion gap: 10 (ref 5–15)
BUN: 15 mg/dL (ref 8–23)
CO2: 25 mmol/L (ref 22–32)
Calcium: 8.9 mg/dL (ref 8.9–10.3)
Chloride: 97 mmol/L — ABNORMAL LOW (ref 98–111)
Creatinine, Ser: 0.74 mg/dL (ref 0.44–1.00)
GFR, Estimated: >60 mL/min (ref >60)
Glucose, Bld: 103 mg/dL — ABNORMAL HIGH (ref 70–99)
Potassium: 3.7 mmol/L (ref 3.5–5.1)
Sodium: 132 mmol/L — ABNORMAL LOW (ref 135–145)
Total Bilirubin: 1 mg/dL (ref 0.3–1.2)
Total Protein: 6.5 g/dL (ref 6.5–8.1)

## 2021-05-23 LAB — TROPONIN I (HIGH SENSITIVITY)
Troponin I (High Sensitivity): 19 ng/L — ABNORMAL HIGH (ref <18)
Troponin I (High Sensitivity): 21 ng/L — ABNORMAL HIGH (ref <18)

## 2021-05-23 MED ORDER — IRBESARTAN 300 MG PO TABS
150.0000 mg | ORAL_TABLET | Freq: Every day | ORAL | Status: DC
  Administered 2021-05-24 – 2021-05-25 (×2): 150 mg via ORAL
  Filled 2021-05-23 (×2): qty 1

## 2021-05-23 MED ORDER — MEMANTINE HCL 10 MG PO TABS
10.0000 mg | ORAL_TABLET | Freq: Two times a day (BID) | ORAL | Status: DC
  Administered 2021-05-24 – 2021-05-25 (×4): 10 mg via ORAL
  Filled 2021-05-23 (×4): qty 1

## 2021-05-23 MED ORDER — LORATADINE 10 MG PO TABS
10.0000 mg | ORAL_TABLET | Freq: Every day | ORAL | Status: DC
Start: 2021-05-24 — End: 2021-05-25
  Administered 2021-05-24 – 2021-05-25 (×2): 10 mg via ORAL
  Filled 2021-05-23 (×2): qty 1

## 2021-05-23 MED ORDER — DULOXETINE HCL 20 MG PO CPEP
20.0000 mg | ORAL_CAPSULE | Freq: Every day | ORAL | Status: DC
  Administered 2021-05-24 – 2021-05-25 (×2): 20 mg via ORAL
  Filled 2021-05-23 (×2): qty 1

## 2021-05-23 MED ORDER — CALCIUM CARBONATE-VITAMIN D 500-200 MG-UNIT PO TABS
2.0000 | ORAL_TABLET | Freq: Every day | ORAL | Status: DC
  Administered 2021-05-24 – 2021-05-25 (×2): 2 via ORAL
  Filled 2021-05-23 (×2): qty 2

## 2021-05-23 MED ORDER — PRAVASTATIN SODIUM 40 MG PO TABS
80.0000 mg | ORAL_TABLET | Freq: Every day | ORAL | Status: DC
  Administered 2021-05-24 – 2021-05-25 (×2): 80 mg via ORAL
  Filled 2021-05-23 (×2): qty 2

## 2021-05-23 MED ORDER — ADULT MULTIVITAMIN W/MINERALS CH
1.0000 | ORAL_TABLET | Freq: Every day | ORAL | Status: DC
  Administered 2021-05-24 – 2021-05-25 (×2): 1 via ORAL
  Filled 2021-05-23 (×2): qty 1

## 2021-05-23 MED ORDER — GADOBUTROL 1 MMOL/ML IV SOLN
7.5000 mL | Freq: Once | INTRAVENOUS | Status: AC | PRN
  Administered 2021-05-23: 7.5 mL via INTRAVENOUS

## 2021-05-23 MED ORDER — APIXABAN 5 MG PO TABS
5.0000 mg | ORAL_TABLET | Freq: Two times a day (BID) | ORAL | Status: DC
  Administered 2021-05-24 – 2021-05-25 (×4): 5 mg via ORAL
  Filled 2021-05-23 (×4): qty 1

## 2021-05-23 NOTE — ED Triage Notes (Signed)
Pt c/o increased weakness since Thursday with multiple falls. Denies chest pain, shob, dizziness. Hx of Afib with blood thinners, denies LOC or hitting head during any falls. Pt family reports pt has been "abnormally quiet". Hx of 11 year ago stroke left sided deficits and walks with walker at baseline. Pt Covid + per family.

## 2021-05-23 NOTE — H&P (Addendum)
Family Medicine Teaching [facility] Admission History and Physical Service Pager: [phone]  Patient name: Sharon Dodson Medical record number: [id number] Date of birth: 05/01/1932 Age: 87 y.o. Gender: female  Primary Care Provider: McDiarmid, Leighton Roach, MD Consultants: None (Neurology curb-sided, no formal consult) Code Status: FULL CODE Preferred Emergency Contact: Lurena Joiner [name]) [place]: [phone]  Chief Complaint: Generalized weakness, recurrent falls  Assessment and Plan: Sharon Dodson is a 87 y.o. female presenting with generalized weakness with recurrent falls for the last 2 days, also presently COVID+. PMH is significant for previous CVA, HTN, Alzheimer's dementia, GERD, permanent atrial fibrillation on Eliquis, recurrent falls, mood disorder.  Generalized Weakness with Recurrent Falls Possible subacute vs chronic infarct in right frontal and parietal lobes Patient was evaluated at [facility] ED for recurrent falls and worsening of her generalized weakness in the last 2 days. Workup in ED included CBC that was unremarkable, CMP with slight hyponatremia at 132, otherwise unremarkable. Troponin's flat at 13>21. CT head with small to moderate volume subacute or chronic infarcts in the right frontal and parietal lobes. Neurology consulted in ED and patient was recommended for transfer for further evaluation with MRI. CT cervical spine without acute osseous injury. MRI returned without acute intracranial abnormality. Neurology was curb-sided here and noted no neurological indication to hold Eliquis. No indication for formal consult at this time given no acute infarct, will plan formal consult if further concerns arise. Differential for worsening weakness and falls include current COVID-19 infection, dehydration, progression of Alzheimer's, malnutrition, hypotension, hyponatremia. Considered anemia as patient is on Eliquis but there is no evidence of active bleeding and Hgb 14. Should always  consider poly-pharmacy as cause for falls in geriatric patients though medication list reviewed and unlikely. Patient has history of permanent atrial fibrillation but appears to be asymptomatic from this. Doubt cardiac etiology as cause given no LOC or associated symptoms of chest pain or SOB. Echo from April showed normal EF at 55-60%, moderate LVH, mild mitral regurgitation and stenosis as well as mild to moderate tricuspid regurgitation. Patient is well-appearing, asymptomatic from COVID-19 infection, and does not appear dehydrated. Suspect her falls were from her known imbalance- notes that she was without her walker during these episodes. Attempted to reach daughter for further information and to inquire about any additional concerns she may have, however I was unable to reach her and the voicemail box was full. Will admit for observation overnight and involve PT/OT for any further recommendations given her fall history and imbalance.  -Admit to FPTS, Dr. Leveda Anna med-tele -Vitals per floor protocol -Cardiac telemetry -Up with assistance -PT/OT eval and treat -Fall precautions -Attempt to update and discuss with daughter in AM  -VTE ppx with Eliquis  Asymptomatic COVID-19 Infection: Acute, stable Found to be positive today. Patient is vaccinated with Pfizer x4. CXR with trace left pleural effusion with associated atelectasis. Oxygen saturation stable on room air. Patient has several risk-factors for severe COVID-19 including dementia, cerebrovascular disease, mood disorder, age. She may benefit from Remdesivir treatment while admitted.  -Airborne and contact precautions -Holding Decadron given no oxygen requirement -Can consider starting Remdesivir; will await to discuss with daughter in AM   Euvolemic Hyponatremia Na 132. Asymptomatic.  -Will monitor; BMP in AM  Hypertension: Chronic BP is stable, most recently 116/69. Did have elevated BP recorded earlier, up to 155/114, however suspect  possible incorrect cuff placement and inaccuracy with these. Home medications include olmesartan 20 mg. Has not tolerated CCB in the past 2/2 leg edema. -Irbesartan 150  mg per hospital formulary   Atrial Fibrillation: Permanent Follows with Dr. Elease Hashimoto with [facility]. Recently seen on 9/12, advised to start Eliquis 5 mg BID and stop Plavix. Patient does not meet requirements for reduced Eliquis at this time given she weight >60 kg and creatinine is less than 1.5.  -Restart Eliquis 5 mg BID -Cardiac monitoring  Hx of CVA  HLD  Has history of prior right parietal infarction in 2011.  Last lipid panel 07/2020 with LDL 73. -Continue pravastatin 80 mg daily  -Will not repeat lipid panel at this time  Osteoporosis: Chronic, stable DEXA scan recently performed July 2022 which showed hip fracture risk 5.1% within the next 10 years and the probability of major osteoporotic fracture was 15.1% within the next 10 years. Home medication: Alendronate 70 mg every 7 days and calcium-Vitamin D supplementation.  -Hold Alendronate (unclear when last dose was) -Continue calcium-vitamin D  -Continue multivitamin -Fall precautions  GERD: Chronic, stable Per chart review appears patient was previously on Famotidine 20 mg daily but this was stopped this year. Patient is taking TUMS PRN.  -Monitor -We can add TUMS PRN if needed  Alzheimer's Dementia: Chronic, stable Home medications include Memantine 10 mg BID. -Continue Memantine 10 mg BID  -Delirium precautions  Unsteady Gait Chronic left arm pain Patient reports remote injury to her left arm. States she declined surgery for it in the past due to the distance she would have to drive to get it. Notes it is weaker than her other arm, no new changes and no current pain. Examination notable for flexed left arm, decreased active ROM, 4/5 strength compared to right UE. There were no deformities appreciated. -PT eval and treat -Defer imaging given chronicity  (confirmed per chart review)  Mood Disorder Recently restarted Cymbalta 20 mg for persistent crying. -Continue Cymbalta daily   Seasonal Allergies On Claritin 10 mg daily. -Continue Claritin    FEN/GI: Heart Healthy  Prophylaxis: Eliquis 5 mg BID   Disposition: Med-Tele  History of Present Illness:  Sharon Dodson is a 87 y.o. female presenting with generalized weakness and recurrent falls. History obtained from patient below. I attempted to contact her daughter, who is her emergency contact, but call went to voicemail and voicemail was full.   Patient states, "I fell a couple times at home and they wanted me to get checked out". States first fall was about 10 days ago, most recent was likely 2-3 days ago. Denies hitting head. States the first time she fell she thinks she hit her toe on furniture and "it tripped me". She fell backwards. The most recent time was when she rolled out of bed, she was unable to get footing on ground and fell out of bed. Her son in law helped her get up after the first fall. She was able to get up with the help of her husband the most recent time. Denies any headache. Doesn't normally fall. Denies feeling weak. "I use the walker all the time, my balance is off", but was not using walker during the falls. States "I fell getting to it". Denies syncope, chest pain, palpitations.   Lives with husband of over 60 years. They live in a senior living facility called [facility]. Daughter lives in town and checks on her often.  She dresses and grooms herself independently. States her son handles finances. Meals are prepared for her at her facility. Walks with walker. She manages her own medications- has a pill Dance movement psychotherapist. She is unsure if she took  her medications today.   Was surprirsed to hear she had tested positive for COVID-19. Has not been feeling ill recently. Denies fever, cough, runny use, chest pain, palpitations, SOB, NVD, dysuria. She reports she is vaccinated with [employer].  Appetite has been normal but notes she hasn't eaten much since she has been in the hospital.    Review Of Systems: Per HPI with the following additions:   Review of Systems  Constitutional:  Negative for activity change, appetite change and fever.  HENT:  Negative for congestion, nosebleeds, rhinorrhea, sneezing and sore throat.   Respiratory:  Negative for cough and shortness of breath.   Cardiovascular:  Negative for chest pain, palpitations and leg swelling.  Gastrointestinal:  Negative for abdominal pain, blood in stool, constipation, diarrhea, nausea and vomiting.  Genitourinary:  Negative for difficulty urinating, dysuria and hematuria.  Musculoskeletal:  Negative for myalgias.  Skin:  Negative for rash.  Neurological:  Negative for dizziness, syncope, weakness, light-headedness, numbness and headaches.  Psychiatric/Behavioral:  Negative for dysphoric mood and sleep disturbance.     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   Falls 04/13/2021   Impaired functional mobility, balance, and endurance 04/13/2021   Hallux abductovalgus with bunions 01/09/2021   Gait abnormality 01/09/2021   LVH (left ventricular hypertrophy) 01/08/2021   Atrial fibrillation, permanent (HCC) 11/14/2020   Ventral hernia, sliding 10/03/2020   Decreased range of motion of left shoulder 05/02/2020   Benign essential HTN 05/01/2020   Environmental and seasonal allergies 01/18/2020   Alzheimer's disease with late onset (HCC) 09/06/2016   Allergic rhinitis 07/02/2014   Insomnia 07/02/2014   Osteoporosis 07/02/2014   Hypercholesterolemia 07/02/2014   GERD (gastroesophageal reflux disease) 07/27/2010   History of cerebral infarction 07/07/2010    Past Medical History: Past Medical History:  Diagnosis Date   Allergic rhinitis 07/02/2014   Anemia 07/02/2014   Ankle edema, bilateral 05/02/2020    Likely venous insufficiency   Atrial fibrillation, permanent (HCC) 11/14/2020   Benign essential HTN 05/01/2020   Cerebral infarction (HCC) 07/27/2010   Decreased range of motion of left shoulder 05/02/2020   Environmental and seasonal allergies 01/18/2020   Gait abnormality 01/09/2021   GERD (gastroesophageal reflux disease) 07/27/2010   H/O reduction of closed dislocation 12/06/2012   Hallux abductovalgus with bunions 01/09/2021   History of cerebral infarction 05/01/2020   Formatting of this note might be different from the original. 07/2010, RIGHT parietal lobe, without residual effects   Hypercholesterolemia 07/02/2014   Insomnia 07/02/2014   LVH (left ventricular hypertrophy) 01/08/2021   Found on transthoracic echocardiogram 11/2020   Nocturia 05/01/2020   Osteoporosis 07/02/2014   Pure hypercholesterolemia 05/01/2020   RCT (rotator cuff tear) 12/21/2012   Shoulder dislocation 10/16/2012   Uncomplicated asthma 05/01/2020   Unsteady gait 07/02/2014   Urgency incontinence 07/02/2014   Venous insufficiency 07/02/2014   Venous stasis dermatitis of right lower extremity 06/03/2020   Ventral hernia, sliding 10/03/2020    Past Surgical History: No past surgical history on file.  Social History: Social History   Tobacco Use   Smoking status: Never   Smokeless tobacco: Never   Additional social history: Lives in senior living facility [place]) with her husband. Has a daughter that lives nearby.  Please also refer to relevant sections of EMR.  Family History: Family History  Adopted: Yes  Family history unknown: Yes   Allergies and Medications:  Allergies  Allergen Reactions   Sulfa Antibiotics Hives and Swelling    Unable to describe reaction. Patient reports reaction occurred > 50 years ago.  Mouth and tongue swelling Mouth and tongue swelling  Mouth and tongue swelling   Sulfasalazine Anaphylaxis and Hives    Anaphylaxis Anaphylaxis   Hydrocodone Nausea Only   No current  facility-administered medications on file prior to encounter.   Current Outpatient Medications on File Prior to Encounter  Medication Sig Dispense Refill   alendronate (FOSAMAX) 70 MG tablet Take 1 tablet (70 mg total) by mouth every 7 (seven) days. Take with a full glass of water on an empty stomach. 12 tablet 3   apixaban (ELIQUIS) 5 MG TABS tablet Take 1 tablet (5 mg total) by mouth 2 (two) times daily. 180 tablet 3   calcium-vitamin D (OSCAL 500/200 D-3) 500-200 MG-UNIT tablet Take 2 tablets by mouth daily with breakfast.     Cyanocobalamin (VITAMIN B-12 PO) Take 1 tablet by mouth daily.      DULoxetine (CYMBALTA) 20 MG capsule Take 20 mg by mouth daily.     loratadine (CLARITIN) 10 MG tablet Take 1 tablet (10 mg total) by mouth daily. 90 tablet 3   memantine (NAMENDA) 10 MG tablet Take 1 tablet (10 mg total) by mouth 2 (two) times daily. 180 tablet 3   Multiple Vitamins-Minerals (PRESERVISION AREDS 2) CAPS Take 1 capsule by mouth daily.     olmesartan (BENICAR) 20 MG tablet Take 1 tablet by mouth daily.     pravastatin (PRAVACHOL) 80 MG tablet Take 1 tablet (80 mg total) by mouth daily. 90 tablet 3   triamcinolone ointment (KENALOG) 0.1 % Apply 1 application topically 2 (two) times daily. 30 g 3    Objective: BP (!) 147/122   Pulse 83   Temp 98.5 F (36.9 C) (Oral)   Resp 15   Ht 5\' 2"  (1.575 m)   Wt 72.6 kg   SpO2 96%   BMI 29.26 kg/m  Exam: General: Awake, alert, oriented to person, place, time, situation, in no acute distress, pleasant and cooperative with examination HEENT: Normocephalic, atraumatic, EOMI, PERRL, nares patent, dentition is good, oropharynx without erythema, no cervical LAD Cardio: Irregularly irregular, rate controlled, 2+ radial, DP and PT pulses b/l Respiratory: CTAB without wheezing/rhonchi/rales, breathing comfortably on room air Abdomen: Soft, non-tender to palpation of all quadrants, non-distended, no rebound/guarding, no organomegaly, normoactive  bowel sounds MSK: Able to move all extremities spontaneously. Left upper extremity with decreased active ROM, kept in flexed position. 4/5 strength in left UE, 5/5 in right UE. Muscle bulk and tone as expected for age Extremities: trace edema b/l lower extremities. Skin: Warm and dry, no ecchymosis or wounds appreciated Neuro:  Cranial Nerves: II: Visual Fields are full. PERRL.  III,IV, VI: EOMI without ptosis or diplopia.  V: Facial sensation is symmetric to temperature VII: Facial movement is symmetric.  VIII: hearing is intact to voice X: Palat elevates symmetrically XI: Shoulder shrug is symmetric. XII: tongue is midline without atrophy or fasciculations.  Motor: Tone and bulk appropriate for age. 5/5 strength in right upper extremity and b/l lower extremities, left upper extremity with 4/5 strength and decreased active ROM compared to right. Sensory: Sensation is symmetric to light touch and temperature in the arms and legs.  Cerebellar: FNF intact bilaterally Psych: Normal mood and affect  Labs and Imaging: CBC BMET  Recent Labs  Lab 05/23/21 1214  WBC 4.2  HGB 14.0  HCT 41.2  PLT  184   Recent Labs  Lab 05/23/21 1214  NA 132*  K 3.7  CL 97*  CO2 25  BUN 15  CREATININE 0.74  GLUCOSE 103*  CALCIUM 8.9     EKG: Atrial fibrillation, rate 80 bpm  CT Head Wo Contrast  Result Date: 05/23/2021 CLINICAL DATA:  Increased weakness for the past 2 days with multiple falls. EXAM: CT HEAD WITHOUT CONTRAST CT CERVICAL SPINE WITHOUT CONTRAST TECHNIQUE: Multidetector CT imaging of the head and cervical spine was performed following the standard protocol without intravenous contrast. Multiplanar CT image reconstructions of the cervical spine were also generated. COMPARISON:  None. FINDINGS: CT HEAD FINDINGS Brain: A small to moderate volume of hypoattenuation, loss of the gray-white matter differentiation, possible cortical petechial hemorrhages, and volume loss in the posterior  right frontal and the anterior right parietal lobe likely represents a subacute or chronic infarct. No evidence of acute hemorrhage, hydrocephalus, extra-axial collection or mass lesion/mass effect. Periventricular white matter hypoattenuation likely represents chronic small vessel ischemic disease. There is mild cerebral volume loss with associated ex vacuo dilatation. Vascular: There are vascular calcifications in the carotid siphons. Skull: Normal. Negative for fracture or focal lesion. Sinuses/Orbits: Bilateral ethmoid sinus disease is noted. Other: None. CT CERVICAL SPINE FINDINGS Alignment: Normal. Skull base and vertebrae: No acute fracture. No primary bone lesion or focal pathologic process. Soft tissues and spinal canal: No prevertebral fluid or swelling. No visible canal hematoma. Disc levels: Up to severe multilevel degenerative disc and joint disease. Upper chest: Negative. Other: None. IMPRESSION: 1. Small to moderate volume subacute or chronic infarct in the right frontal and parietal lobes. 2. No acute osseous injury in the cervical spine. These results were called by telephone at the time of interpretation on 05/23/2021 at 1:03 pm to provider HALEY SAGE PA, who verbally acknowledged these results. Electronically Signed   By: Romona Curls M.D.   On: 05/23/2021 13:07   CT Cervical Spine Wo Contrast  Result Date: 05/23/2021 CLINICAL DATA:  Increased weakness for the past 2 days with multiple falls. EXAM: CT HEAD WITHOUT CONTRAST CT CERVICAL SPINE WITHOUT CONTRAST TECHNIQUE: Multidetector CT imaging of the head and cervical spine was performed following the standard protocol without intravenous contrast. Multiplanar CT image reconstructions of the cervical spine were also generated. COMPARISON:  None. FINDINGS: CT HEAD FINDINGS Brain: A small to moderate volume of hypoattenuation, loss of the gray-white matter differentiation, possible cortical petechial hemorrhages, and volume loss in the posterior  right frontal and the anterior right parietal lobe likely represents a subacute or chronic infarct. No evidence of acute hemorrhage, hydrocephalus, extra-axial collection or mass lesion/mass effect. Periventricular white matter hypoattenuation likely represents chronic small vessel ischemic disease. There is mild cerebral volume loss with associated ex vacuo dilatation. Vascular: There are vascular calcifications in the carotid siphons. Skull: Normal. Negative for fracture or focal lesion. Sinuses/Orbits: Bilateral ethmoid sinus disease is noted. Other: None. CT CERVICAL SPINE FINDINGS Alignment: Normal. Skull base and vertebrae: No acute fracture. No primary bone lesion or focal pathologic process. Soft tissues and spinal canal: No prevertebral fluid or swelling. No visible canal hematoma. Disc levels: Up to severe multilevel degenerative disc and joint disease. Upper chest: Negative. Other: None. IMPRESSION: 1. Small to moderate volume subacute or chronic infarct in the right frontal and parietal lobes. 2. No acute osseous injury in the cervical spine. These results were called by telephone at the time of interpretation on 05/23/2021 at 1:03 pm to provider HALEY SAGE PA, who verbally  acknowledged these results. Electronically Signed   By: Romona Curls M.D.   On: 05/23/2021 13:07   MR Brain W and Wo Contrast  Result Date: 05/23/2021 CLINICAL DATA:  Altered mental status. EXAM: MRI HEAD WITHOUT AND WITH CONTRAST TECHNIQUE: Multiplanar, multiecho pulse sequences of the brain and surrounding structures were obtained without and with intravenous contrast. CONTRAST:  7.104mL GADAVIST GADOBUTROL 1 MMOL/ML IV SOLN COMPARISON:  None. FINDINGS: Brain: No acute infarct, mass effect or extra-axial collection. No acute or chronic hemorrhage. Hyperintense T2-weighted signal is widespread throughout the white matter. Diffuse, severe atrophy. The midline structures are normal. Vascular: Major flow voids are preserved. Skull and  upper cervical spine: Normal calvarium and skull base. Visualized upper cervical spine and soft tissues are normal. Sinuses/Orbits:No paranasal sinus fluid levels or advanced mucosal thickening. No mastoid or middle ear effusion. Normal orbits. IMPRESSION: 1. No acute intracranial abnormality. 2. Severe atrophy and chronic microvascular disease. Electronically Signed   By: Deatra Robinson M.D.   On: 05/23/2021 21:09   DG Chest Portable 1 View  Result Date: 05/23/2021 CLINICAL DATA:  Weakness EXAM: PORTABLE CHEST 1 VIEW COMPARISON:  None FINDINGS: The heart is enlarged. Vascular calcifications are seen in the aortic arch. There is a trace left pleural effusion with associated atelectasis. The right lung is clear and there is no right pleural effusion. There is no pneumothorax. A chronic fracture is seen of the right eighth rib. There is diffuse osseous demineralization and degenerative changes in the spine. IMPRESSION: Trace left pleural effusion with associated atelectasis. Aortic Atherosclerosis (ICD10-I70.0). Electronically Signed   By: Romona Curls M.D.   On: 05/23/2021 13:08     Sabino Dick, DO 05/23/2021, 6:58 PM PGY-2, [facility] FPTS Intern pager: [phone], text pages welcome

## 2021-05-23 NOTE — ED Provider Notes (Addendum)
MEDCENTER [facility] EMERGENCY DEPT Provider Note   CSN: [id number] Arrival date & time: 05/23/21  1152     History Chief Complaint  Patient presents with   Sharon Dodson is a 87 y.o. female.  HPI  Patient with history of atrial fibrillation on Eliquis presents with fall.  Patient has been acting more reserved and quiet compared to her normal baseline.  She is usually able to walk with a walker for assistance but in the last few days she has been taking smaller steps and not feeling like she is able to walk.  Feels like "she cannot feel her feet below her".  Patient and son-in-law went out for dinner and patient had a fall on Thursday.  She was very reserved and appeared diaphoretic at dinner but denied any chest pain or shortness of breath.  The next day she had another fall and landed with her head on a pillow falling out of the bed.  Denies any loss of consciousness.  Denies any dysuria, chest pain, shortness of breath, fevers.    Patient did test positive for COVID earlier this morning.  Past Medical History:  Diagnosis Date   Allergic rhinitis 07/02/2014   Anemia 07/02/2014   Ankle edema, bilateral 05/02/2020   Likely venous insufficiency   Atrial fibrillation, permanent (HCC) 11/14/2020   Benign essential HTN 05/01/2020   Cerebral infarction (HCC) 07/27/2010   Decreased range of motion of left shoulder 05/02/2020   Environmental and seasonal allergies 01/18/2020   Gait abnormality 01/09/2021   GERD (gastroesophageal reflux disease) 07/27/2010   H/O reduction of closed dislocation 12/06/2012   Hallux abductovalgus with bunions 01/09/2021   History of cerebral infarction 05/01/2020   Formatting of this note might be different from the original. 07/2010, RIGHT parietal lobe, without residual effects   Hypercholesterolemia 07/02/2014   Insomnia 07/02/2014   LVH (left ventricular hypertrophy) 01/08/2021   Found on transthoracic echocardiogram 11/2020   Nocturia 05/01/2020    Osteoporosis 07/02/2014   Pure hypercholesterolemia 05/01/2020   RCT (rotator cuff tear) 12/21/2012   Shoulder dislocation 10/16/2012   Uncomplicated asthma 05/01/2020   Unsteady gait 07/02/2014   Urgency incontinence 07/02/2014   Venous insufficiency 07/02/2014   Venous stasis dermatitis of right lower extremity 06/03/2020   Ventral hernia, sliding 10/03/2020    Patient Active Problem List   Diagnosis Date Noted   Mood disorder (HCC) 04/27/2021   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   Falls 04/13/2021   Impaired functional mobility, balance, and endurance 04/13/2021   Hallux abductovalgus with bunions 01/09/2021   Gait abnormality 01/09/2021   LVH (left ventricular hypertrophy) 01/08/2021   Atrial fibrillation, permanent (HCC) 11/14/2020   Ventral hernia, sliding 10/03/2020   Decreased range of motion of left shoulder 05/02/2020   Benign essential HTN 05/01/2020   Environmental and seasonal allergies 01/18/2020   Alzheimer's disease with late onset (HCC) 09/06/2016   Allergic rhinitis 07/02/2014   Insomnia 07/02/2014   Osteoporosis 07/02/2014   Hypercholesterolemia 07/02/2014   GERD (gastroesophageal reflux disease) 07/27/2010   History of cerebral infarction 07/07/2010    No past surgical history on file.   OB History   No obstetric history on file.     Family History  Adopted: Yes  Family history unknown: Yes    Social History   Tobacco Use   Smoking status: Never   Smokeless tobacco: Never    Home Medications Prior to Admission medications   Medication Sig Start Date End Date Taking? Authorizing  Provider  alendronate (FOSAMAX) 70 MG tablet Take 1 tablet (70 mg total) by mouth every 7 (seven) days. Take with a full glass of water on an empty stomach. 03/17/21   McDiarmid, Leighton Roach, MD  apixaban (ELIQUIS) 5 MG TABS tablet Take 1 tablet (5 mg total) by mouth 2 (two) times daily. 11/17/20   Nahser, Deloris Ping, MD  calcium-vitamin D (OSCAL 500/200 D-3) 500-200  MG-UNIT tablet Take 2 tablets by mouth daily with breakfast. 10/03/20   McDiarmid, Leighton Roach, MD  Cyanocobalamin (VITAMIN B-12 PO) Take 1 tablet by mouth daily.     [provider]  DULoxetine (CYMBALTA) 20 MG capsule Take 20 mg by mouth daily.    [provider]  loratadine (CLARITIN) 10 MG tablet Take 1 tablet (10 mg total) by mouth daily. 01/08/21   McDiarmid, Leighton Roach, MD  memantine (NAMENDA) 10 MG tablet Take 1 tablet (10 mg total) by mouth 2 (two) times daily. 11/13/20   McDiarmid, Leighton Roach, MD  Multiple Vitamins-Minerals (PRESERVISION AREDS 2) CAPS Take 1 capsule by mouth daily.    [provider]  olmesartan (BENICAR) 20 MG tablet Take 1 tablet by mouth daily. 01/18/20   [provider]  pravastatin (PRAVACHOL) 80 MG tablet Take 1 tablet (80 mg total) by mouth daily. 01/08/21   McDiarmid, Leighton Roach, MD  triamcinolone ointment (KENALOG) 0.1 % Apply 1 application topically 2 (two) times daily. 11/13/20   McDiarmid, Leighton Roach, MD    Allergies    Sulfa antibiotics, Sulfasalazine, and Hydrocodone  Review of Systems   Review of Systems  Constitutional:  Negative for chills and fever.  HENT:  Negative for ear pain and sore throat.   Eyes:  Negative for pain and visual disturbance.  Respiratory:  Negative for cough, chest tightness and shortness of breath.   Cardiovascular:  Negative for chest pain, palpitations and leg swelling.  Gastrointestinal:  Negative for abdominal pain, nausea and vomiting.  Genitourinary:  Negative for dysuria and hematuria.  Musculoskeletal:  Positive for gait problem. Negative for arthralgias, back pain and joint swelling.  Skin:  Negative for color change, rash and wound.  Neurological:  Positive for weakness. Negative for dizziness, seizures, syncope, light-headedness and numbness.  Psychiatric/Behavioral:  Positive for confusion.   All other systems reviewed and are negative.  Physical Exam Updated Vital Signs BP 132/76 (BP Location: Right  Arm)   Pulse 87   Temp 98.5 F (36.9 C) (Oral)   Resp 16   Ht 5\' 2"  (1.575 m)   Wt 72.6 kg   SpO2 96%   BMI 29.26 kg/m   Physical Exam Vitals and nursing note reviewed. Exam conducted with a chaperone present.  Constitutional:      Appearance: Normal appearance.  HENT:     Head: Normocephalic and atraumatic.  Eyes:     General: No scleral icterus.       Right eye: No discharge.        Left eye: No discharge.     Extraocular Movements: Extraocular movements intact.     Pupils: Pupils are equal, round, and reactive to light.  Cardiovascular:     Rate and Rhythm: Normal rate. Rhythm irregular.     Pulses: Normal pulses.     Heart sounds: Normal heart sounds. No murmur heard.   No friction rub. No gallop.  Pulmonary:     Effort: Pulmonary effort is normal. No respiratory distress.     Breath sounds: Normal breath sounds.  Abdominal:  General: Abdomen is flat. Bowel sounds are normal. There is no distension.     Palpations: Abdomen is soft.     Tenderness: There is no abdominal tenderness.     Comments: Abdomen is soft and nontender  Musculoskeletal:     Comments: No hip tenderness to palpation.  Able to flex and extend at the cervical spine, no midline tenderness.  Skin:    General: Skin is warm and dry.     Coloration: Skin is not jaundiced.     Comments: Less than 1 cm laceration noted to second finger of left hand from fall.  Bleeding controlled  Neurological:     Mental Status: She is alert. Mental status is at baseline.     Coordination: Coordination abnormal.     Comments: Oriented x3.  Cranial nerves III through XII are grossly intact.  Sensation to light touch is intact to the lower and upper extremity and is the same bilaterally.  Grip strength is equal bilaterally.    ED Results / Procedures / Treatments   Labs (all labs ordered are listed, but only abnormal results are displayed) Labs Reviewed - No data to display  EKG None  Radiology No results  found.  Procedures Procedures   Medications Ordered in ED Medications - No data to display  ED Course  I have reviewed the triage vital signs and the nursing notes.  Pertinent labs & imaging results that were available during my care of the patient were reviewed by me and considered in my medical decision making (see chart for details).    MDM Rules/Calculators/A&P                           Patient is COVID-positive, also having generalized weakness resulting in multiple falls in the last 2 days.  No missed doses of Eliquis.  CT with possible subacute infarct noted.  Spoke with Bing Neighbors with neurology, she advises getting MRI with and without contrast and disposition depending on that.  Given that patient is having multiple falls and is having difficulty ambulating plus she is COVID-positive with possible subacute, patient needs admission. Will admit to family teaching services and plan to transfer patient to [facility] for MRI.  Advised with holding on the Eliquis until after the MRI is completed.  Spoke with family medicine teaching service, they will accept the patient.  We will proceed to transfer to [facility].  Discussed HPI, physical exam and plan of care for this patient with attending R. Dykstra. The attending physician evaluated this patient as part of a shared visit and agrees with plan of care.   Final Clinical Impression(s) / ED Diagnoses Final diagnoses:  None    Rx / DC Orders ED Discharge Orders     None        Theron Arista, PA-C 05/23/21 1344    Theron Arista, PA-C 05/23/21 1738    Milagros Loll, MD 05/24/21 [phone]

## 2021-05-23 NOTE — ED Notes (Signed)
Per Md, Eliquis to be held tonight.

## 2021-05-23 NOTE — ED Notes (Signed)
CRITICAL VALUE STICKER  CRITICAL VALUE:COVID +  RECEIVER (on-site recipient of call):Carmon Ginsberg, RN  DATE & TIME NOTIFIED: 05-23-2021 1439  MESSENGER (representative from lab):  MD NOTIFIED: Dr. Stevie Kern  TIME OF NOTIFICATION:1440  RESPONSE:

## 2021-05-23 NOTE — Progress Notes (Signed)
Received report from Angela RN.

## 2021-05-23 NOTE — Plan of Care (Signed)
MRI brain reviewed, agree with radiology, no acute intracranial process.  No neurological indication to hold Eliquis at this time, may be resumed per primary team if no other concern for bleeding risk  Generalized weakness as described in chart likely secondary to COVID19, treatment per primary team.   Neurology will be available on an as needed basis, please reach out if new concerns arise. This is a brief curbside at the request of primary team and does not replace full consult.   Brooke Dare MD-PhD [facility] [phone]  Available 7 PM to 7 AM, outside of these hours please call Neurologist on call as listed on Amion.

## 2021-05-23 NOTE — ED Notes (Signed)
Pt states does not need to urinate at this time. Declined food and water.

## 2021-05-23 NOTE — ED Notes (Signed)
Report called to [place],RN, [address] and [facility] RN carelink.

## 2021-05-24 ENCOUNTER — Encounter (HOSPITAL_COMMUNITY): Payer: Self-pay | Admitting: Family Medicine

## 2021-05-24 DIAGNOSIS — R531 Weakness: Secondary | ICD-10-CM | POA: Diagnosis not present

## 2021-05-24 DIAGNOSIS — W19XXXA Unspecified fall, initial encounter: Secondary | ICD-10-CM | POA: Diagnosis not present

## 2021-05-24 DIAGNOSIS — U071 COVID-19: Secondary | ICD-10-CM | POA: Diagnosis not present

## 2021-05-24 LAB — BASIC METABOLIC PANEL
Anion gap: 10 (ref 5–15)
BUN: 15 mg/dL (ref 8–23)
CO2: 24 mmol/L (ref 22–32)
Calcium: 8.5 mg/dL — ABNORMAL LOW (ref 8.9–10.3)
Chloride: 97 mmol/L — ABNORMAL LOW (ref 98–111)
Creatinine, Ser: 0.77 mg/dL (ref 0.44–1.00)
GFR, Estimated: >60 mL/min (ref >60)
Glucose, Bld: 91 mg/dL (ref 70–99)
Potassium: 3.5 mmol/L (ref 3.5–5.1)
Sodium: 131 mmol/L — ABNORMAL LOW (ref 135–145)

## 2021-05-24 LAB — LIPID PANEL
Cholesterol: 128 mg/dL (ref 0–200)
HDL: 60 mg/dL (ref >40)
LDL Cholesterol: 54 mg/dL (ref 0–99)
Total CHOL/HDL Ratio: 2.1 RATIO
Triglycerides: 72 mg/dL (ref <150)
VLDL: 14 mg/dL (ref 0–40)

## 2021-05-24 NOTE — Discharge Instructions (Signed)
It was a pleasure to take care of you while you are in the hospital!  While here you were treated for falls and concern for stroke.  We were able to determine that you did not have a stroke.  Please take all of your medications as prescribed, and be sure to follow-up with your cardiologist and your primary care provider as indicated.  If you have similar symptoms please return to the emergency department to be reevaluated.  You were incidentally found to also have COVID-19 infection.  Please wear a mask and you should stay at home for the next 3 days.  After that you may go about your business, but please be sure to wear a mask when out in public for at least 10 days after a positive test.  If you have trouble breathing, shortness of breath, or you are not able to keep down food or drink, please come to the emergency department to be evaluated.  Evaluated by physical therapy and Occupational Therapy while in the hospital.  They recommend that you receive physical therapy and Occupational Therapy at home.  This has been ordered and will be set up for you.

## 2021-05-24 NOTE — Plan of Care (Signed)
Patient does not appear to have any respiratory compromise from Covid

## 2021-05-24 NOTE — Progress Notes (Signed)
Patient was to be discharged today as she is at her baseline functioning. However, daughter asked for stay for one more night to help set up safe disposition. Patient cannot get into her bed on her own with chronically injured arm and husband cannot help due to his infirmities. HH aide cannot come while immediately COVID positive and daughter unable to do it tonight. It was also unclear if patient could get meals brought to her by the independent living facility and daughter requested some time today to help organize meals for the next few days. Daughter agreed for patient to return to independent living facility tomorrow. This request is appropriate for patient safety.  Shirlean Mylar, MD [facility] Family Medicine Residency, PGY-3

## 2021-05-24 NOTE — Progress Notes (Signed)
Family Medicine Teaching Service Daily Progress Note Intern Pager: [phone]  Patient name: Sharon Dodson Medical record number: [id number] Date of birth: Jan 25, 1932 Age: 87 y.o. Gender: female  Primary Care Provider: McDiarmid, Leighton Roach, MD Consultants: none Code Status: Full  Pt Overview and Major Events to Date:  9/17: admitted for falls, c/f stroke, COVID+  Assessment and Plan: Sharon Dodson strep is a 87 year old woman presenting with generalized weakness with recurrent falls for the last 2 days, also presently COVID-positive.  PMH is significant for previous CVA, hypertension, Alzheimer's dementia, GERD, permanent atrial fibrillation on Eliquis, recurrent falls, mood disorder.  Generalized weakness with recurrent falls Patient brought in for observation overnight due to concern for stroke.  This was ruled out with MRI that showed no acute abnormalities.  Patient has had several falls in recent weeks including 1 in the last few days.  She will need evaluation by PT/OT prior to returning to her assisted living facility today. Fortunately, she has no injuries and is feeling well. She is appropriate for discharge after PT/OT. She reports she lives in the independent section of an ALF and they have PT that can be arranged out patient on site if necessary. -PT/OT evaluation and treatment - Up with assistance  Asymptomatic COVID-19 infection: Acute and stable Patient incidentally found to be positive on admission yesterday.  She is without symptoms, vital signs are stable. -Airborne and contact precautions - Holding Decadron given no oxygen requirement - Can consider remdesivir, unlikely given that patient is completely asymptomatic  Euvolemic hyponatremia Patient found to have sodium 132 on admission, today is 131.  On chart review for about the last year she has had normal sodium.  Concern for SIADH.  Will obtain urine sodium and urine osmolality and recommend patient follow up with her PCP. Will also  encourage PO hydration. Could be due to COVID+, however patient is otherwise asx. Recommend follow up with PCP. -Urine Na, urine osmolality  Hypertension chronic Has been stable though hypertensive.  Most recent blood pressure 139/109.  Home medication includes olmesartan 20 mg. -Continue home medication -Recommend patient follow-up with primary care provider for hypertensive management  Permanent atrial fibrillation Patient follows with Dr. Elease Hashimoto at health care, seeing this month.  She has stopped Plavix recently and continues on Eliquis 5 mg twice daily.  Telemetry reveals that she has been rate controlled. If continued falls, must discuss risk/benefit of continued eliquis. - Continue Eliquis 5 mg twice daily  History of CVA, HLD Total cholesterol appropriate 128, HDL appropriate at 60, LDL at 54.  She is at goal currently.   -Continue home pravastatin 80 mg daily  The rest of her problems are chronic and stable, see H&P for more information  FEN/GI: Regular diet PPx: eliquis for a fib Dispo:Pending PT recommendations  today. Barriers include awaiting PT/OT evaluation.   Subjective:  Patient reports that she feels quite well and would like to go home. No SOB, HA, no injuries.   Objective: Temp:  [98.4 F (36.9 C)-98.8 F (37.1 C)] 98.4 F (36.9 C) (09/18 0830) Pulse Rate:  [80-94] 88 (09/18 0830) Resp:  [14-21] 20 (09/18 0830) BP: (116-155)/(69-126) 139/109 (09/18 0830) SpO2:  [92 %-100 %] 100 % (09/18 0830) Weight:  [72.6 kg] 72.6 kg (09/17 1203) Physical Exam: General: Elderly WW, resting comfortably in chair, NAD, alert and at baseline, mildly overweight Cardiovascular: Regular rate, irregularly irregular rhythm, no M/R/G, 2+ radial pulses  Respiratory: CTAB, no increased work of breathing Abdomen: Soft, NT, ND, normal bowel  sounds Extremities: Warm, no edema  Laboratory: Recent Labs  Lab 05/23/21 1214  WBC 4.2  HGB 14.0  HCT 41.2  PLT 184   Recent Labs  Lab  05/23/21 1214 05/24/21 0127  NA 132* 131*  K 3.7 3.5  CL 97* 97*  CO2 25 24  BUN 15 15  CREATININE 0.74 0.77  CALCIUM 8.9 8.5*  PROT 6.5  --   BILITOT 1.0  --   ALKPHOS 80  --   ALT 17  --   AST 25  --   GLUCOSE 103* 91   Imaging/Diagnostic Tests: CT Head Wo Contrast  Result Date: 05/23/2021 CLINICAL DATA:  Increased weakness for the past 2 days with multiple falls. EXAM: CT HEAD WITHOUT CONTRAST CT CERVICAL SPINE WITHOUT CONTRAST TECHNIQUE: Multidetector CT imaging of the head and cervical spine was performed following the standard protocol without intravenous contrast. Multiplanar CT image reconstructions of the cervical spine were also generated. COMPARISON:  None. FINDINGS: CT HEAD FINDINGS Brain: A small to moderate volume of hypoattenuation, loss of the gray-white matter differentiation, possible cortical petechial hemorrhages, and volume loss in the posterior right frontal and the anterior right parietal lobe likely represents a subacute or chronic infarct. No evidence of acute hemorrhage, hydrocephalus, extra-axial collection or mass lesion/mass effect. Periventricular white matter hypoattenuation likely represents chronic small vessel ischemic disease. There is mild cerebral volume loss with associated ex vacuo dilatation. Vascular: There are vascular calcifications in the carotid siphons. Skull: Normal. Negative for fracture or focal lesion. Sinuses/Orbits: Bilateral ethmoid sinus disease is noted. Other: None. CT CERVICAL SPINE FINDINGS Alignment: Normal. Skull base and vertebrae: No acute fracture. No primary bone lesion or focal pathologic process. Soft tissues and spinal canal: No prevertebral fluid or swelling. No visible canal hematoma. Disc levels: Up to severe multilevel degenerative disc and joint disease. Upper chest: Negative. Other: None. IMPRESSION: 1. Small to moderate volume subacute or chronic infarct in the right frontal and parietal lobes. 2. No acute osseous injury in  the cervical spine. These results were called by telephone at the time of interpretation on 05/23/2021 at 1:03 pm to provider HALEY SAGE PA, who verbally acknowledged these results. Electronically Signed   By: Romona Curls M.D.   On: 05/23/2021 13:07   CT Cervical Spine Wo Contrast  Result Date: 05/23/2021 CLINICAL DATA:  Increased weakness for the past 2 days with multiple falls. EXAM: CT HEAD WITHOUT CONTRAST CT CERVICAL SPINE WITHOUT CONTRAST TECHNIQUE: Multidetector CT imaging of the head and cervical spine was performed following the standard protocol without intravenous contrast. Multiplanar CT image reconstructions of the cervical spine were also generated. COMPARISON:  None. FINDINGS: CT HEAD FINDINGS Brain: A small to moderate volume of hypoattenuation, loss of the gray-white matter differentiation, possible cortical petechial hemorrhages, and volume loss in the posterior right frontal and the anterior right parietal lobe likely represents a subacute or chronic infarct. No evidence of acute hemorrhage, hydrocephalus, extra-axial collection or mass lesion/mass effect. Periventricular white matter hypoattenuation likely represents chronic small vessel ischemic disease. There is mild cerebral volume loss with associated ex vacuo dilatation. Vascular: There are vascular calcifications in the carotid siphons. Skull: Normal. Negative for fracture or focal lesion. Sinuses/Orbits: Bilateral ethmoid sinus disease is noted. Other: None. CT CERVICAL SPINE FINDINGS Alignment: Normal. Skull base and vertebrae: No acute fracture. No primary bone lesion or focal pathologic process. Soft tissues and spinal canal: No prevertebral fluid or swelling. No visible canal hematoma. Disc levels: Up to severe multilevel degenerative disc  and joint disease. Upper chest: Negative. Other: None. IMPRESSION: 1. Small to moderate volume subacute or chronic infarct in the right frontal and parietal lobes. 2. No acute osseous injury in  the cervical spine. These results were called by telephone at the time of interpretation on 05/23/2021 at 1:03 pm to provider HALEY SAGE PA, who verbally acknowledged these results. Electronically Signed   By: Romona Curls M.D.   On: 05/23/2021 13:07   MR Brain W and Wo Contrast  Result Date: 05/23/2021 CLINICAL DATA:  Altered mental status. EXAM: MRI HEAD WITHOUT AND WITH CONTRAST TECHNIQUE: Multiplanar, multiecho pulse sequences of the brain and surrounding structures were obtained without and with intravenous contrast. CONTRAST:  7.64mL GADAVIST GADOBUTROL 1 MMOL/ML IV SOLN COMPARISON:  None. FINDINGS: Brain: No acute infarct, mass effect or extra-axial collection. No acute or chronic hemorrhage. Hyperintense T2-weighted signal is widespread throughout the white matter. Diffuse, severe atrophy. The midline structures are normal. Vascular: Major flow voids are preserved. Skull and upper cervical spine: Normal calvarium and skull base. Visualized upper cervical spine and soft tissues are normal. Sinuses/Orbits:No paranasal sinus fluid levels or advanced mucosal thickening. No mastoid or middle ear effusion. Normal orbits. IMPRESSION: 1. No acute intracranial abnormality. 2. Severe atrophy and chronic microvascular disease. Electronically Signed   By: Deatra Robinson M.D.   On: 05/23/2021 21:09   DG Chest Portable 1 View  Result Date: 05/23/2021 CLINICAL DATA:  Weakness EXAM: PORTABLE CHEST 1 VIEW COMPARISON:  None FINDINGS: The heart is enlarged. Vascular calcifications are seen in the aortic arch. There is a trace left pleural effusion with associated atelectasis. The right lung is clear and there is no right pleural effusion. There is no pneumothorax. A chronic fracture is seen of the right eighth rib. There is diffuse osseous demineralization and degenerative changes in the spine. IMPRESSION: Trace left pleural effusion with associated atelectasis. Aortic Atherosclerosis (ICD10-I70.0). Electronically Signed    By: Romona Curls M.D.   On: 05/23/2021 13:08     Shirlean Mylar, MD 05/24/2021, 10:09 AM PGY-3,  Family Medicine FPTS Intern pager: [phone], text pages welcome

## 2021-05-24 NOTE — Hospital Course (Addendum)
Baylea Milburn is a 87 y.o. female who was admitted to [facility] on 9/17 for generalized weakness and recurrent falls. She was incidentally found to be COVID-19 positive as well. Her PMHx is significant for Alzheimer's dementia, permanent atrial fibrillation on Eliquis, HTN, HLD, GERD, previous CVA in 2011. Her hospital course listed by problem. For additional information, please refer to the H&P.   Generalized Weakness, Recurrent falls likely 2/2 imbalance and unsteady gait Alzheimer's Dementia Patient presented as a transfer from [facility] emergency department.  Work-up in the ED remarkable for CT head with small to moderate volume subacute versus chronic infarcts in the right frontal and parietal lobes.  Patient was transferred to [facility] for MRI and admission.  Her MRI was unremarkable and did not demonstrate any acute abnormalities.  Her blood work was significant for hyponatremia at 132, thought to be noncontributory. Her falls were thought to be related to her unsteady gait. There was no concern for cardiac etiology. She was evaluated by PT and OT during hospitalization which recommended to continue home health PT and OT, which was ordered for her.   Asymptomatic COVID-19 Infection Found to be COVID positive on admission. Was asymptomatic and stable on room air, thus did not need decadron. Patient has been vaccinated and boosted twice. Patient did not receive remdesivir or steroids given that she was asymptomatic.  Hyponatremia Patient had mild hyponatremia on admission to 132, the morning of discharge was 131.  On exam chart review patient previously had normal sodiums upper 130s and 140.  This could be due to her COVID infection, or decreased oral intake the last few days.  Urine sodium and urine osmolality was obtained during the hospital course, but has not yet resulted.  Recommend patient follow-up with PCP in a week to ensure resolution of hyponatremia consider further  work-up if urine values are abnormal.  Permanent Atrial Fibrillation Followed by Cardiology outpatient. Was continued on Eliquis during hospitalization. If falls continue, must evaluate risk-benefit of Eliquis.  All other chronic conditions stable.   Follow Up Recommendations: Follow up urine sodium and urine osmolality, repeat BMP to evaluate hyponatremia. Encourage oral hydration. Follow up for gait steadiness If repeat falls, recommend closer follow up with Cardiology to discuss risk vs benefit of eliquis.

## 2021-05-24 NOTE — Plan of Care (Signed)
  Problem: Education: Goal: Knowledge of risk factors and measures for prevention of condition will improve Outcome: Progressing   Problem: Coping: Goal: Psychosocial and spiritual needs will be supported Outcome: Progressing   Problem: Respiratory: Goal: Will maintain a patent airway Outcome: Progressing Goal: Complications related to the disease process, condition or treatment will be avoided or minimized Outcome: Progressing   

## 2021-05-24 NOTE — Progress Notes (Signed)
Physical Therapy Evaluation Patient Details Name: Shaylynn Nulty MRN: [id number] DOB: Sep 27, 1931 Today's Date: 05/24/2021  History of Present Illness  Farrie Sann is a 87 y.o. female admitted 9/17 presenting with generalized weakness with recurrent falls for the last 2 days, also presently COVID+. PMH is significant for previous CVA, HTN, Alzheimer's dementia, GERD, permanent atrial fibrillation on Eliquis, recurrent falls, mood disorder.  Clinical Impression  Pt admitted with above diagnosis. Pt was able to ambulate with min guard assist in room with RW.  Pt unsteady without RW.  Educated pt to use RW at all times on D/C until her balance improved with therapy and pt agrees. She states her husband can assist her and is availaable at all times on d/c .  Has a RW.  Will follow while in hospital.  Pt currently with functional limitations due to the deficits listed below (see PT Problem List). Pt will benefit from skilled PT to increase their independence and safety with mobility to allow discharge to the venue listed below.          Recommendations for follow up therapy are one component of a multi-disciplinary discharge planning process, led by the attending physician.  Recommendations may be updated based on patient status, additional functional criteria and insurance authorization.  Follow Up Recommendations Home health PT    Equipment Recommendations  None recommended by PT    Recommendations for Other Services       Precautions / Restrictions Precautions Precautions: Fall Restrictions Weight Bearing Restrictions: No      Mobility  Bed Mobility Overal bed mobility: Needs Assistance Bed Mobility: Supine to Sit     Supine to sit: Min assist     General bed mobility comments: Took incr time to come to EOB and cues.  Assist a little with LEs off bed and a little with trunk elevation. Purewick leaked therefore had to clean pt sitting EOB and change gown.  NT was in room as well.     Transfers Overall transfer level: Needs assistance Equipment used: Rolling walker (2 wheeled) Transfers: Sit to/from Stand Sit to Stand: Min guard         General transfer comment: cues for hand placement.  Ambulation/Gait Ambulation/Gait assistance: Min guard Gait Distance (Feet): 45 Feet Assistive device: Rolling walker (2 wheeled) Gait Pattern/deviations: Step-through pattern;Decreased stride length;Trunk flexed;Wide base of support;Drifts right/left   Gait velocity interpretation: <1.31 ft/sec, indicative of household ambulator General Gait Details: Pt was able to ambulate with RW with overall good safety. Pt needed occasional assist with moving RW and cues to keep RW close and with her at all times. Pt agrees to use RW for safety when she first goes home.  Pt is unsteady without RW at this time.  Stairs            Wheelchair Mobility    Modified Rankin (Stroke Patients Only)       Balance Overall balance assessment: Needs assistance Sitting-balance support: No upper extremity supported;Feet supported Sitting balance-Leahy Scale: Fair     Standing balance support: Bilateral upper extremity supported;During functional activity Standing balance-Leahy Scale: Poor Standing balance comment: relies on UE support for balance dynamically, was able to wash herself and use 1 UE support to bathe and was abel to wipe herself after getting off toilet.                             Pertinent Vitals/Pain Pain Assessment: No/denies pain  Home Living Family/patient expects to be discharged to:: Private residence Living Arrangements: Spouse/significant other Available Help at Discharge: Available 24 hours/day;Family Type of Home: Independent living facility [facility]) Home Access: Level entry     Home Layout: One level Home Equipment: Grab bars - tub/shower;Shower seat;Walker - 2 wheels;Grab bars - toilet;Hand held shower head      Prior Function Level of  Independence: Independent with assistive device(s);Needs assistance   Gait / Transfers Assistance Needed: walked to meals -2 long hallways  ADL's / Homemaking Assistance Needed: I with ADLS        Hand Dominance   Dominant Hand: Right    Extremity/Trunk Assessment   Upper Extremity Assessment Upper Extremity Assessment: Defer to OT evaluation    Lower Extremity Assessment Lower Extremity Assessment: Generalized weakness    Cervical / Trunk Assessment Cervical / Trunk Assessment: Kyphotic  Communication   Communication: No difficulties  Cognition Arousal/Alertness: Awake/alert Behavior During Therapy: WFL for tasks assessed/performed Overall Cognitive Status: Within Functional Limits for tasks assessed                                        General Comments General comments (skin integrity, edema, etc.): 88 bpm, 96% RA    Exercises     Assessment/Plan    PT Assessment Patient needs continued PT services  PT Problem List Decreased activity tolerance;Decreased balance;Decreased mobility;Decreased knowledge of use of DME;Decreased safety awareness;Decreased knowledge of precautions       PT Treatment Interventions DME instruction;Gait training;Functional mobility training;Therapeutic activities;Therapeutic exercise;Balance training;Patient/family education    PT Goals (Current goals can be found in the Care Plan section)  Acute Rehab PT Goals Patient Stated Goal: to go home today PT Goal Formulation: With patient Time For Goal Achievement: 06/07/21 Potential to Achieve Goals: Good    Frequency Min 3X/week   Barriers to discharge        Co-evaluation               AM-PAC PT "6 Clicks" Mobility  Outcome Measure Help needed turning from your back to your side while in a flat bed without using bedrails?: None Help needed moving from lying on your back to sitting on the side of a flat bed without using bedrails?: A Little Help needed  moving to and from a bed to a chair (including a wheelchair)?: A Little Help needed standing up from a chair using your arms (e.g., wheelchair or bedside chair)?: A Little Help needed to walk in hospital room?: A Little Help needed climbing 3-5 steps with a railing? : A Little 6 Click Score: 19    End of Session Equipment Utilized During Treatment: Gait belt Activity Tolerance: Patient tolerated treatment well Patient left: in chair;with call bell/phone within reach;with chair alarm set;with nursing/sitter in room Nurse Communication: Mobility status PT Visit Diagnosis: Unsteadiness on feet (R26.81)    Time: 3329-5188 PT Time Calculation (min) (ACUTE ONLY): 44 min   Charges:   PT Evaluation $PT Eval Moderate Complexity: 1 Mod PT Treatments $Gait Training: 23-37 mins        Tyshon Fanning M,PT Acute Rehab Services [phone] (pager)   Bevelyn Buckles 05/24/2021, 10:18 AM

## 2021-05-24 NOTE — Discharge Summary (Signed)
Family Medicine Teaching [facility] Discharge Summary  Patient name: Sharon Dodson Medical record number: [id number] Date of birth: Mar 31, 1932 Age: 87 y.o. Gender: female Date of Admission: 05/23/2021  Date of Discharge: 05/24/21 Admitting Physician: Moses Manners, MD  Primary Care Provider: McDiarmid, Leighton Roach, MD Consultants: None  Indication for Hospitalization: fall  Discharge Diagnoses/Problem List:  Fall Permanent A. fib on Eliquis History of CVA Hypertension Alzheimer's dementia GERD Mood disorder  Disposition: To home  Discharge Condition: Stable  Discharge Exam:  Temp:  [98.4 F (36.9 C)-98.8 F (37.1 C)] 98.4 F (36.9 C) (09/18 0830) Pulse Rate:  [80-94] 88 (09/18 0830) Resp:  [14-21] 20 (09/18 0830) BP: (116-155)/(69-126) 139/109 (09/18 0830) SpO2:  [92 %-100 %] 100 % (09/18 0830) Weight:  [72.6 kg] 72.6 kg (09/17 1203) Physical Exam: General: Elderly WW, resting comfortably in chair, NAD, alert and at baseline, mildly overweight Cardiovascular: Regular rate, irregularly irregular rhythm, no M/R/G, 2+ radial pulses  Respiratory: CTAB, no increased work of breathing Abdomen: Soft, NT, ND, normal bowel sounds Extremities: Warm, no edema  Brief Hospital Course:  Sharon Dodson is a 87 y.o. female who was admitted to [facility] on 9/17 for generalized weakness and recurrent falls. She was incidentally found to be COVID-19 positive as well. Her PMHx is significant for Alzheimer's dementia, permanent atrial fibrillation on Eliquis, HTN, HLD, GERD, previous CVA in 2011. Her hospital course listed by problem. For additional information, please refer to the H&P.   Generalized Weakness, Recurrent falls likely 2/2 imbalance and unsteady gait Alzheimer's Dementia Patient presented as a transfer from [facility] emergency department.  Work-up in the ED remarkable for CT head with small to moderate volume subacute versus chronic infarcts in the right frontal and  parietal lobes.  Patient was transferred to [facility] for MRI and admission.  Her MRI was unremarkable and did not demonstrate any acute abnormalities.  Her blood work was significant for hyponatremia at 132, thought to be noncontributory. Her falls were thought to be related to her unsteady gait. There was no concern for cardiac etiology. She was evaluated by PT and OT during hospitalization which recommended to continue home health PT and OT, which was ordered for her.   Asymptomatic COVID-19 Infection Found to be COVID positive on admission. Was asymptomatic and stable on room air, thus did not need decadron. Patient has been vaccinated and boosted twice. Patient did not receive remdesivir or steroids given that she was asymptomatic.  Hyponatremia Patient had mild hyponatremia on admission to 132, the morning of discharge was 131.  On exam chart review patient previously had normal sodiums upper 130s and 140.  This could be due to her COVID infection, or decreased oral intake the last few days.  Urine sodium and urine osmolality was obtained during the hospital course, but has not yet resulted.  Recommend patient follow-up with PCP in a week to ensure resolution of hyponatremia consider further work-up if urine values are abnormal.  Permanent Atrial Fibrillation Followed by Cardiology outpatient. Was continued on Eliquis during hospitalization. If falls continue, must evaluate risk-benefit of Eliquis.  All other chronic conditions stable.   Follow Up Recommendations: Follow up urine sodium and urine osmolality, repeat BMP to evaluate hyponatremia Follow up for gait steadiness If repeat falls, recommend closer follow up with Cardiology to discuss risk vs benefit of eliquis.  Significant Procedures: None  Significant Labs and Imaging:  Recent Labs  Lab 05/23/21 1214  WBC 4.2  HGB 14.0  HCT  41.2  PLT 184   Recent Labs  Lab 05/23/21 1214 05/24/21 0127  NA 132* 131*  K 3.7  3.5  CL 97* 97*  CO2 25 24  GLUCOSE 103* 91  BUN 15 15  CREATININE 0.74 0.77  CALCIUM 8.9 8.5*  ALKPHOS 80  --   AST 25  --   ALT 17  --   ALBUMIN 3.8  --    Results/Tests Pending at Time of Discharge: Ur Na and osmolalilty  Discharge Medications:  Allergies as of 05/24/2021       Reactions   Sulfa Antibiotics Hives, Swelling   Unable to describe reaction. Patient reports reaction occurred > 50 years ago.  Mouth and tongue swelling Mouth and tongue swelling Mouth and tongue swelling   Sulfasalazine Anaphylaxis, Hives   Anaphylaxis Anaphylaxis   Hydrocodone Nausea Only        Medication List     TAKE these medications    alendronate 70 MG tablet Commonly known as: FOSAMAX Take 1 tablet (70 mg total) by mouth every 7 (seven) days. Take with a full glass of water on an empty stomach.   apixaban 5 MG Tabs tablet Commonly known as: Eliquis Take 1 tablet (5 mg total) by mouth 2 (two) times daily.   DULoxetine 20 MG capsule Commonly known as: CYMBALTA Take 20 mg by mouth daily.   loratadine 10 MG tablet Commonly known as: CLARITIN Take 1 tablet (10 mg total) by mouth daily.   memantine 10 MG tablet Commonly known as: NAMENDA Take 1 tablet (10 mg total) by mouth 2 (two) times daily.   olmesartan 20 MG tablet Commonly known as: BENICAR Take 1 tablet by mouth daily.   Oscal 500/200 D-3 500-200 MG-UNIT tablet Generic drug: calcium-vitamin D Take 2 tablets by mouth daily with breakfast.   pravastatin 80 MG tablet Commonly known as: PRAVACHOL Take 1 tablet (80 mg total) by mouth daily.   PreserVision AREDS 2 Caps Take 1 capsule by mouth daily.   triamcinolone ointment 0.1 % Commonly known as: KENALOG Apply 1 application topically 2 (two) times daily.   VITAMIN B-12 PO Take 1 tablet by mouth daily.       Discharge Instructions: Please refer to Patient Instructions section of EMR for full details.  Patient was counseled important signs and symptoms  that should prompt return to medical care, changes in medications, dietary instructions, activity restrictions, and follow up appointments.   Follow-Up Appointments:  Follow-up Information     McDiarmid, Leighton Roach, MD. Go on 06/04/2021.   Specialty: Family Medicine Why: Please arrive 15 mins early for a 9:30 AM appointment Contact information: [address] [place] [address] [phone]                 Shirlean Mylar, MD 05/24/2021, 2:55 PM PGY-3, [facility]

## 2021-05-24 NOTE — TOC Transition Note (Signed)
Transition of Care [facility]) - CM/SW Discharge Note   Patient Details  Name: Sharon Dodson MRN: [id number] Date of Birth: 1932/07/31  Transition of Care [facility] CM/SW Contact:  Lawerance Sabal, RN Phone Number: 05/24/2021, 3:08 PM   Clinical Narrative:    Patient active w [facility]. Referral for [facility] services made to liaison, and notified of discharge today.          Patient Goals and CMS Choice        Discharge Placement                       Discharge Plan and Services                                     Social Determinants of Health (SDOH) Interventions     Readmission Risk Interventions No flowsheet data found.

## 2021-05-24 NOTE — Evaluation (Signed)
Occupational Therapy Evaluation Patient Details Name: Sharon Dodson MRN: [id number] DOB: 1932-06-28 Today's Date: 05/24/2021   History of Present Illness Sharon Dodson is a 87 y.o. female admitted 9/17 presenting with generalized weakness with recurrent falls for the last 2 days, also presently COVID+. PMH is significant for previous CVA, HTN, Alzheimer's dementia, GERD, permanent atrial fibrillation on Eliquis, recurrent falls, mood disorder.   Clinical Impression   Pt admitted for concerns listed above. PTA pt reported that she was independent with all ADL's and her ILF provides all needed IADL's. Pt presents this session with decreased functional mobility, balance deficits, and weakness. She is requiring min A for all functional mobility and LB ADL's, due to balance concerns and weakness. Pt educated on the importance of using her RW at all times once discharged home for safety, and to take frequent rest breaks as needed for energy conservation. OT will follow acutely.      Recommendations for follow up therapy are one component of a multi-disciplinary discharge planning process, led by the attending physician.  Recommendations may be updated based on patient status, additional functional criteria and insurance authorization.   Follow Up Recommendations  Home health OT    Equipment Recommendations  None recommended by OT    Recommendations for Other Services       Precautions / Restrictions Precautions Precautions: Fall Restrictions Weight Bearing Restrictions: No      Mobility Bed Mobility Overal bed mobility: Needs Assistance Bed Mobility: Supine to Sit     Supine to sit: Min assist     General bed mobility comments: Pt up in recliner on entry    Transfers Overall transfer level: Needs assistance Equipment used: Rolling walker (2 wheeled) Transfers: Sit to/from Stand Sit to Stand: Min guard;Min assist         General transfer comment: Min A when ambulating without  RW, min guard with RW    Balance Overall balance assessment: Needs assistance Sitting-balance support: No upper extremity supported;Feet supported Sitting balance-Leahy Scale: Good Sitting balance - Comments: Able to bend over and doff/don socks independently   Standing balance support: Bilateral upper extremity supported;During functional activity Standing balance-Leahy Scale: Poor Standing balance comment: relies on UE support for balance dynamically, was able to wash herself and use 1 UE support to bathe and was abel to wipe herself after getting off toilet.                           ADL either performed or assessed with clinical judgement   ADL Overall ADL's : Needs assistance/impaired Eating/Feeding: Modified independent;Sitting   Grooming: Set up;Sitting   Upper Body Bathing: Sitting;Supervision/ safety Upper Body Bathing Details (indicate cue type and reason): Needs assist with hair washing due to ROM difficulties Lower Body Bathing: Minimal assistance;Sitting/lateral leans;Sit to/from stand   Upper Body Dressing : Supervision/safety;Sitting   Lower Body Dressing: Minimal assistance;Sit to/from stand Lower Body Dressing Details (indicate cue type and reason): Assist for balance in standing Toilet Transfer: Min guard;Ambulation;RW   Toileting- Clothing Manipulation and Hygiene: Min guard;Minimal assistance;Sit to/from stand       Functional mobility during ADLs: Min guard;Minimal assistance;Rolling walker General ADL Comments: Pt demonstrating some weakness and balance concerns, requiring supervision- min A for ADL's     Vision Baseline Vision/History: 1 Wears glasses Ability to See in Adequate Light: 1 Impaired Patient Visual Report: No change from baseline Vision Assessment?: No apparent visual deficits     Perception Perception  Perception Tested?: No   Praxis Praxis Praxis tested?: Within functional limits    Pertinent Vitals/Pain Pain  Assessment: No/denies pain     Hand Dominance Right   Extremity/Trunk Assessment Upper Extremity Assessment Upper Extremity Assessment: LUE deficits/detail;Generalized weakness LUE Deficits / Details: Pt ROM limited due to prior falls, pt reported 20 years ago. She is unable to bring her hand up to her head and sustain it there LUE Coordination: decreased gross motor   Lower Extremity Assessment Lower Extremity Assessment: Defer to PT evaluation   Cervical / Trunk Assessment Cervical / Trunk Assessment: Kyphotic   Communication Communication Communication: No difficulties   Cognition Arousal/Alertness: Awake/alert Behavior During Therapy: WFL for tasks assessed/performed Overall Cognitive Status: Within Functional Limits for tasks assessed                                     General Comments  VSS on RA    Exercises     Shoulder Instructions      Home Living Family/patient expects to be discharged to:: Private residence Living Arrangements: Spouse/significant other Available Help at Discharge: Available 24 hours/day;Family Type of Home: Independent living facility [facility]) Home Access: Level entry     Home Layout: One level     Bathroom Shower/Tub: Producer, television/film/video: Standard Bathroom Accessibility: Yes How Accessible: Accessible via walker Home Equipment: Grab bars - tub/shower;Shower seat;Walker - 2 wheels;Grab bars - toilet;Hand held shower head          Prior Functioning/Environment Level of Independence: Independent with assistive device(s);Needs assistance  Gait / Transfers Assistance Needed: walked to meals -2 long hallways ADL's / Homemaking Assistance Needed: I with ADLS            OT Problem List: Decreased strength;Decreased range of motion;Decreased activity tolerance;Impaired balance (sitting and/or standing);Decreased coordination;Decreased safety awareness;Decreased knowledge of use of DME or AE      OT  Treatment/Interventions: Self-care/ADL training;Therapeutic exercise;Energy conservation;DME and/or AE instruction;Therapeutic activities;Patient/family education;Balance training    OT Goals(Current goals can be found in the care plan section) Acute Rehab OT Goals Patient Stated Goal: to go home today OT Goal Formulation: With patient Time For Goal Achievement: 05/24/21 Potential to Achieve Goals: Good ADL Goals Pt Will Perform Grooming: with modified independence;standing Pt Will Perform Lower Body Bathing: with modified independence;sitting/lateral leans;sit to/from stand Pt Will Perform Lower Body Dressing: with modified independence;sitting/lateral leans;sit to/from stand Pt Will Transfer to Toilet: with modified independence;ambulating Pt Will Perform Toileting - Clothing Manipulation and hygiene: with modified independence;sitting/lateral leans;sit to/from stand  OT Frequency: Min 2X/week   Barriers to D/C:            Co-evaluation              AM-PAC OT "6 Clicks" Daily Activity     Outcome Measure Help from another person eating meals?: None Help from another person taking care of personal grooming?: A Little Help from another person toileting, which includes using toliet, bedpan, or urinal?: A Little Help from another person bathing (including washing, rinsing, drying)?: A Little Help from another person to put on and taking off regular upper body clothing?: A Little Help from another person to put on and taking off regular lower body clothing?: A Little 6 Click Score: 19   End of Session Equipment Utilized During Treatment: Rolling walker;Gait belt Nurse Communication: Mobility status  Activity Tolerance: Patient tolerated treatment well  Patient left: in chair;with call bell/phone within reach;with chair alarm set  OT Visit Diagnosis: Unsteadiness on feet (R26.81);Other abnormalities of gait and mobility (R26.89);Muscle weakness (generalized) (M62.81);Repeated  falls (R29.6)                Time: 5035-4656 OT Time Calculation (min): 24 min Charges:  OT General Charges $OT Visit: 1 Visit OT Evaluation $OT Eval Moderate Complexity: 1 Mod OT Treatments $Self Care/Home Management : 8-22 mins  Jasean Ambrosia H., OTR/L Acute Rehabilitation  Yatzari Jonsson Elane Mackay Hanauer 05/24/2021, 10:37 AM

## 2021-05-25 ENCOUNTER — Other Ambulatory Visit: Payer: Self-pay | Admitting: Family Medicine

## 2021-05-25 DIAGNOSIS — U071 COVID-19: Secondary | ICD-10-CM | POA: Diagnosis not present

## 2021-05-25 DIAGNOSIS — W19XXXA Unspecified fall, initial encounter: Secondary | ICD-10-CM | POA: Diagnosis not present

## 2021-05-25 DIAGNOSIS — R531 Weakness: Secondary | ICD-10-CM | POA: Diagnosis not present

## 2021-05-25 LAB — BASIC METABOLIC PANEL
Anion gap: 9 (ref 5–15)
BUN: 14 mg/dL (ref 8–23)
CO2: 27 mmol/L (ref 22–32)
Calcium: 8.8 mg/dL — ABNORMAL LOW (ref 8.9–10.3)
Chloride: 95 mmol/L — ABNORMAL LOW (ref 98–111)
Creatinine, Ser: 0.85 mg/dL (ref 0.44–1.00)
GFR, Estimated: >60 mL/min (ref >60)
Glucose, Bld: 91 mg/dL (ref 70–99)
Potassium: 3.7 mmol/L (ref 3.5–5.1)
Sodium: 131 mmol/L — ABNORMAL LOW (ref 135–145)

## 2021-05-25 LAB — OSMOLALITY, URINE: Osmolality, Ur: 514 mOsm/kg (ref 300–900)

## 2021-05-25 LAB — SODIUM, URINE, RANDOM: Sodium, Ur: 36 mmol/L

## 2021-05-25 NOTE — Progress Notes (Signed)
FPTS Brief Progress Note  S: Lying in bed sleeping.    O: BP 120/83   Pulse 94   Temp 98 F (36.7 C) (Oral)   Resp 13   Ht 5\' 2"  (1.575 m)   Wt 72.6 kg   SpO2 93%   BMI 29.26 kg/m     A/P: 1. Weakness  - Orders reviewed. Labs for AM ordered, which was adjusted as needed.  - Plan to discharge tomorrow  , DO 05/25/2021, 12:10 AM PGY-3, [facility] Night Resident  Please page [phone] with questions.

## 2021-05-25 NOTE — TOC Progression Note (Signed)
Transition of Care [facility]) - Progression Note    Patient Details  Name: Sharon Dodson MRN: [id number] Date of Birth: 1932/09/02  [facility] CM/SW Contact  Beckie Busing, RN Phone Number:[phone]  05/25/2021, 2:27 PM  Clinical Narrative:    CM received message from bedside nurse to inquire if patient has been set up with [facility]. CM has called [facility] and verified with Kandee Keen that the patient is set up with [facility] PT/OT/RN/ [facility] aide. No other needs noted at this time. TOC will sign off.          Expected Discharge Plan and Services           Expected Discharge Date: 05/25/21                           [facility] Agency: [facility]         Social Determinants of Health (SDOH) Interventions    Readmission Risk Interventions No flowsheet data found.

## 2021-05-25 NOTE — Plan of Care (Signed)
  Problem: Education: Goal: Knowledge of risk factors and measures for prevention of condition will improve Outcome: Progressing   

## 2021-05-27 ENCOUNTER — Telehealth: Payer: Self-pay

## 2021-05-27 DIAGNOSIS — N390 Urinary tract infection, site not specified: Secondary | ICD-10-CM

## 2021-05-27 LAB — URINE CULTURE: Culture: 20000 — AB

## 2021-05-27 NOTE — Telephone Encounter (Signed)
Patient's daughter calls nurse line regarding patient. Patient was recently hospitalized on Saturday, 9/17 and discharged on 9/19. Daughter has concerns about possible UTI and is requesting to speak with Dr. McDiarmid regarding starting antibiotics. Urine culture obtained during hospital stay.   Patient was also scheduled for follow up on Friday, 9/23. This will put her at 6 days after positive COVID test. Please advise if this appointment should be virtual or rescheduled.   Veronda Prude, RN

## 2021-05-27 NOTE — Telephone Encounter (Signed)
I received a message from Dr. Elease Hashimoto that patient's daughter, Emiliano Dyer, had called with concerns for her mother that she is more altered and that the urine cx returned with e coli 20K CFUs. Ms. [name] reports that patient has been more altered than her usual baseline. For example, she did not get dressed yesterday (only wearing a t-shirt), which is not patient's routine. She has not sought out food or drink on her own, and requires daily monitoring to be encouraged to eat and drink. She also was sitting in a robe and appeared sweaty. Not checked for fever, but has not otherwise appeared to have a fever. Denies chills, cough, runny nose, n/v/d, no complaints of dysuria, frequency, or urgency.   Patient most likely has vascular dementia based on CT head/MRI done at recent hospitalization, this could be a progression of disease vs delirium from acute process that has so far not been delineated.  I recommend Ms. [name] monitor for fever and above symptoms. Urine cx most likely represents colonization, common in the elderly, and abx are not indicated in absence of sx and 20K CFUs. Return precautions discussed. I have made a closer appt with Dr. Allena Katz, first available, to assess for any other cause for change in behavior and attention. Based on conversation, I do not think patient needs emergent evaluation though. Will keep appt next week with patient's PCP as well, Dr. McDiarmid, for continuity of care. Patient's daughter is in agreement with this plan.  Shirlean Mylar, MD [facility] Residency, PGY-3

## 2021-05-28 ENCOUNTER — Telehealth: Payer: Self-pay

## 2021-05-28 MED ORDER — CEPHALEXIN 500 MG PO CAPS: 500.0000 mg | ORAL_CAPSULE | Freq: Three times a day (TID) | ORAL | 0 refills | Status: AC

## 2021-05-28 NOTE — Addendum Note (Signed)
Addended byPerley Jain, Jaeanna Mccomber D on: 05/28/2021 01:12 PM   Modules accepted: Orders

## 2021-05-28 NOTE — Telephone Encounter (Signed)
I spoke with daughter of patient.   While patient has returned to walking, her confusion persists. No fever/dysuria/pain complaint.  Given possible delirium, it may not be possible to get an accurate history for GU specific sxs.   Daughter and I agreed to trial of Keflex x 7d for possible UTI with delirium from E coli 10^4 with mixed spp.  Keflex sensitive.   Daughter may cancel tomorrow''s appointment at [facility]. I will follow up patient next week in clinic.  Precautions for immediately seeking healthcare given.

## 2021-05-28 NOTE — Telephone Encounter (Signed)
Tyler from [facility] calling for PT verbal orders as follows:  1 time(s) weekly for 8 week(s)  Verbal orders given per [facility] protocol  Mushka Laconte C Artemio Dobie, RN  

## 2021-05-29 ENCOUNTER — Ambulatory Visit: Payer: Medicare Other | Admitting: Family Medicine

## 2021-05-29 NOTE — Telephone Encounter (Signed)
Thank you Dr McDiarmid for calling this pt. I believe she cancelled her visit today and she will see you next week.

## 2021-06-01 ENCOUNTER — Telehealth: Payer: Self-pay

## 2021-06-01 NOTE — Telephone Encounter (Signed)
Joselyn Glassman from [facility] calls nurse line requesting verbal order for speech therapist and skilled nursing evaluation.   Verbal orders given per protocol.   Veronda Prude, RN

## 2021-06-02 NOTE — Telephone Encounter (Signed)
Agree with order

## 2021-06-04 ENCOUNTER — Encounter: Payer: Self-pay | Admitting: Family Medicine

## 2021-06-04 ENCOUNTER — Ambulatory Visit (INDEPENDENT_AMBULATORY_CARE_PROVIDER_SITE_OTHER): Payer: Medicare Other | Admitting: Family Medicine

## 2021-06-04 ENCOUNTER — Other Ambulatory Visit: Payer: Self-pay

## 2021-06-04 VITALS — BP 120/70 | HR 88 | Ht 62.0 in | Wt 154.0 lb

## 2021-06-04 DIAGNOSIS — R634 Abnormal weight loss: Secondary | ICD-10-CM | POA: Diagnosis not present

## 2021-06-04 DIAGNOSIS — E875 Hyperkalemia: Secondary | ICD-10-CM

## 2021-06-04 DIAGNOSIS — I10 Essential (primary) hypertension: Secondary | ICD-10-CM

## 2021-06-04 DIAGNOSIS — U071 COVID-19: Secondary | ICD-10-CM | POA: Diagnosis not present

## 2021-06-04 DIAGNOSIS — E871 Hypo-osmolality and hyponatremia: Secondary | ICD-10-CM

## 2021-06-04 NOTE — Patient Instructions (Addendum)
Please decrease your olmesartan to half a tablet (10 mg ) a day, then come back to the office for a Blood Pressure check in 4 weeks.   Checking your electrolytes today.  You have lost about 15 pounds in the last year.  Lets drink a can of Ensure High Protein, at least a can a day.  Drink after eating a meal.

## 2021-06-05 ENCOUNTER — Encounter: Payer: Self-pay | Admitting: Family Medicine

## 2021-06-05 DIAGNOSIS — E875 Hyperkalemia: Secondary | ICD-10-CM | POA: Insufficient documentation

## 2021-06-05 DIAGNOSIS — R634 Abnormal weight loss: Secondary | ICD-10-CM | POA: Insufficient documentation

## 2021-06-05 LAB — BASIC METABOLIC PANEL
BUN/Creatinine Ratio: 20 (ref 12–28)
BUN: 18 mg/dL (ref 8–27)
CO2: 25 mmol/L (ref 20–29)
Calcium: 10 mg/dL (ref 8.7–10.3)
Chloride: 100 mmol/L (ref 96–106)
Creatinine, Ser: 0.89 mg/dL (ref 0.57–1.00)
Glucose: 95 mg/dL (ref 70–99)
Potassium: 5.7 mmol/L — ABNORMAL HIGH (ref 3.5–5.2)
Sodium: 141 mmol/L (ref 134–144)
eGFR: 63 mL/min/{1.73_m2} (ref >59)

## 2021-06-05 NOTE — Progress Notes (Signed)
Follow up outpatient visit after Hospitalization  Sharon Dodson is accompanied by daughter, [name] Sources of clinical information for visit is/are patient, relative(s), and Discharge Summary. The Discharge Summary for the hospitalization from 05/23/21 to 05/24/21 was reviewed.  Nursing assessment for this office visit was reviewed with the patient for accuracy and revision.   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 emergency department.  Work-up in the ED remarkable for CT head with small to moderate volume subacute versus chronic infarcts in the right frontal and parietal lobes.  Patient was transferred to [facility] for MRI and admission.  Her MRI was unremarkable and did not demonstrate any acute abnormalities.  Her blood work was significant for hyponatremia at 132, thought to be noncontributory. Her falls were thought to be related to her unsteady gait. There was no concern for cardiac etiology. She was evaluated by PT and OT during hospitalization which recommended to continue home health PT and OT, which was ordered for her. Small left pleural effusion on CXR.   Asymptomatic COVID-19 Infection Found to be COVID positive on admission. Was thought to be asymptomatic and stable on room air, thus did not need decadron. Patient has been vaccinated and boosted twice. Patient did not receive remdesivir or steroids given that she was asymptomatic. Follow up instructions from patient's hospital healthcare providers:  Follow up urine sodium and urine osmolality, repeat BMP to evaluate hyponatremia Follow up for gait steadiness  ---------------------------------------------------------------------------------------------------------------------- Problems since hospital discharge:  Onset: Confusion since DC.   Decreased activity, less conversant, not dressing as she usually  would. Urine Cx form hospitalization E coli 10^4 Sn Cefazolin Empiric start of keflex 9/22 for 7 days.  Daughter reporting patient more alert, more active, performing her ADLs independently.   ---------------------------------------------------------------------------------------------------------------------- Follow up appointments with specialists:  none ---------------------------------------------------------------------------------------------------------------------- New medications started during hospitalization: None Chronic medications stopped during hospitalization: none Patient's Medication List was updated in the EMR: yes --------------------------------------------------------------------------------------------------------------------- Home Health Services: HHPT and HH Occupational Therapy ordered from hospital and have started.  Ordered from our office for Speech Therapy and RN monitoring sent in this seek in response to [facility] request.  Durable Medical Equipment: Dan Humphreys already at home.  No new equipment.  --------------------------------------------------------------------------------------------------------------------- ADLs Independent Needs Assistance Dependent  Bathing x    Dressing x    Ambulation x    Toileting x    Eating x     IADL Independent Needs Assistance Dependent  Cooking   x  Housework   x  Manage Medications   x  Manage the telephone x    Shopping for food, clothes, Meds, etc  x   Use transportation   x  Manage Finances   x

## 2021-06-05 NOTE — Assessment & Plan Note (Signed)
New, recurrent problem Episode of hyperkalemia 5.7 in Nov 2021, that returned to normal within few days of increase by mouth fluids.   Plan Increase by mouth fluids Decrease olmesartan to 10 mg from 20 mg Recheck bmet next office visit 4 weeks.

## 2021-06-05 NOTE — Assessment & Plan Note (Signed)
Resolving, though may have had some acute effects on memory and cognition. Will monitor for fuller recovery.

## 2021-06-05 NOTE — Assessment & Plan Note (Signed)
New problem Weight lown 15 pounds from about one year ago.  Down 5 pounds over last couple weeks.   Appetite has increased greatly over last few days since patient feeling better.   Will ascribe the most recent rapid weight loss to recent illness, but will need to monitor the gradual decline over the last year.

## 2021-06-05 NOTE — Assessment & Plan Note (Signed)
Established problem Standing BP SBP 105.   Excessive BP control.  Plan: decrease olmesartan from 20 mg ddaily to 10 mg.  RETURN TO CLINIC 4 weeks to assess response.   Check Standing BPs

## 2021-06-16 ENCOUNTER — Other Ambulatory Visit: Payer: Self-pay | Admitting: Family Medicine

## 2021-06-16 DIAGNOSIS — I872 Venous insufficiency (chronic) (peripheral): Secondary | ICD-10-CM

## 2021-07-09 ENCOUNTER — Encounter: Payer: Self-pay | Admitting: Family Medicine

## 2021-07-09 ENCOUNTER — Ambulatory Visit (INDEPENDENT_AMBULATORY_CARE_PROVIDER_SITE_OTHER): Payer: Medicare Other | Admitting: Family Medicine

## 2021-07-09 ENCOUNTER — Other Ambulatory Visit: Payer: Self-pay

## 2021-07-09 VITALS — BP 110/68 | HR 60 | Ht 62.0 in | Wt 158.0 lb

## 2021-07-09 DIAGNOSIS — E875 Hyperkalemia: Secondary | ICD-10-CM

## 2021-07-09 DIAGNOSIS — I10 Essential (primary) hypertension: Secondary | ICD-10-CM | POA: Diagnosis not present

## 2021-07-09 NOTE — Patient Instructions (Signed)
We are checking your potassium today to make certain it has returned to normal after lowering your olmesartan medication.    Your blood pressure is better today on the lower dose of olmesartan.

## 2021-07-09 NOTE — Progress Notes (Addendum)
20 minute visit CPT E&M Office Visit Time Before Visit; reviewing medical records (e.g. recent visits, labs, studies): 5 minutes During Visit (F2F time): 10 minutes After Visit (discussion with family or HCP, prescribing, ordering, referring, calling result/recommendations or documenting on same day): 5 minutes Total Visit Time: 20 minutes  Sharon Dodson is accompanied by daughter Sources of clinical information for visit is/are relative(s). Daughter, [name] Nursing assessment for this office visit was reviewed with the patient for accuracy and revision.     Previous Report(s) Reviewed: lab reports and recent hospital discharge summary  Depression screen [facility] 2/9 07/09/2021  Decreased Interest 0  Down, Depressed, Hopeless 0  PHQ - 2 Score 0  Altered sleeping 0  Tired, decreased energy 0  Change in appetite 0  Feeling bad or failure about yourself  0  Trouble concentrating 1  Moving slowly or fidgety/restless 0  Suicidal thoughts 0  PHQ-9 Score 1  Difficult doing work/chores Somewhat difficult  Some recent data might be hidden   Loss adjuster, chartered Office Visit from 07/09/2021 in [facility] Office Visit from 06/04/2021 in [facility] Office Visit from 05/05/2021 in [facility]  Thoughts that you would be better off dead, or of hurting yourself in some way Not at all Not at all Not at all  PHQ-9 Total Score 1 16 0       Fall Risk  07/09/2021 06/04/2021 04/23/2021 01/08/2021 11/13/2020  Falls in the past year? 1 1 1  0 0  Number falls in past yr: 1 0 1 0 0  Injury with Fall? 0 0 1 0 0  Risk for fall due to : - - - - -    PHQ9 SCORE ONLY 07/09/2021 06/04/2021 05/05/2021  PHQ-9 Total Score 1 16 0    Adult vaccines due  Topic Date Due   TETANUS/TDAP  03/12/2016    Health Maintenance Due  Topic Date Due   TETANUS/TDAP  03/12/2016      History/P.E. limitations: mild dementia  Adult vaccines due  Topic Date Due    TETANUS/TDAP  03/12/2016   There are no preventive care reminders to display for this patient.  Health Maintenance Due  Topic Date Due   TETANUS/TDAP  03/12/2016     Chief Complaint  Patient presents with   Follow-up

## 2021-07-10 LAB — BASIC METABOLIC PANEL
BUN/Creatinine Ratio: 33 — ABNORMAL HIGH (ref 12–28)
BUN: 28 mg/dL — ABNORMAL HIGH (ref 8–27)
CO2: 26 mmol/L (ref 20–29)
Calcium: 9.8 mg/dL (ref 8.7–10.3)
Chloride: 101 mmol/L (ref 96–106)
Creatinine, Ser: 0.84 mg/dL (ref 0.57–1.00)
Glucose: 87 mg/dL (ref 70–99)
Potassium: 5.2 mmol/L (ref 3.5–5.2)
Sodium: 140 mmol/L (ref 134–144)
eGFR: 67 mL/min/{1.73_m2} (ref >59)

## 2021-07-10 NOTE — Assessment & Plan Note (Signed)
Established problem that has improved.  Basic Metabolic Panel:    Component Value Date/Time   NA 140 07/09/2021 1156   K 5.2 07/09/2021 1156   CL 101 07/09/2021 1156   CO2 26 07/09/2021 1156   BUN 28 (H) 07/09/2021 1156   CREATININE 0.84 07/09/2021 1156   GLUCOSE 87 07/09/2021 1156   GLUCOSE 91 05/25/2021 0330   CALCIUM 9.8 07/09/2021 1156  hyperkalemia resolved with lower dose of olmesartan 10 mg daily

## 2021-07-10 NOTE — Assessment & Plan Note (Signed)
Established problem Well Controlled. Today's Vitals   07/09/21 1123  BP: 110/68  Pulse: 60  SpO2: 98%  Weight: 158 lb (71.7 kg)  Height: 5\' 2"  (1.575 m)  PainSc: 0-No pain   Body mass index is 28.9 kg/m.  Continue lower dose olmesartan 10 mg daily

## 2021-08-10 ENCOUNTER — Encounter: Payer: Self-pay | Admitting: Family Medicine

## 2021-08-14 NOTE — Telephone Encounter (Signed)
[name] called and just wanted to inform PCP that patient had a fall on 08/08/21.  She was leaning against an automatic door when it opened and she fell backwards.  She hasn't been walking as well as she was prior to this and physical therapy is aware at the facility and will be looking in on her to see if they can assist.  Daughter will call next week if mother isn't improved by next week.  She described it as she has "almost forgotten her legs".  Sharon Dodson,CMA

## 2021-10-01 ENCOUNTER — Ambulatory Visit (INDEPENDENT_AMBULATORY_CARE_PROVIDER_SITE_OTHER): Payer: Medicare Other | Admitting: Family Medicine

## 2021-10-01 ENCOUNTER — Encounter: Payer: Self-pay | Admitting: Family Medicine

## 2021-10-01 ENCOUNTER — Other Ambulatory Visit: Payer: Self-pay

## 2021-10-01 DIAGNOSIS — I10 Essential (primary) hypertension: Secondary | ICD-10-CM | POA: Diagnosis not present

## 2021-10-01 DIAGNOSIS — I4821 Permanent atrial fibrillation: Secondary | ICD-10-CM

## 2021-10-01 DIAGNOSIS — W19XXXD Unspecified fall, subsequent encounter: Secondary | ICD-10-CM

## 2021-10-01 DIAGNOSIS — G301 Alzheimer's disease with late onset: Secondary | ICD-10-CM | POA: Diagnosis not present

## 2021-10-01 NOTE — Patient Instructions (Signed)
Your blood pressure looks good.   Your weight is stable.  Your heart is still in atrial fibrillation but the rate is controlled.    Consider a trial off of the memantine.  If you do not notice a difference in your thinking and memory off the medication after two months, you can stay off the memantine.  If you do notice a difference anytime, then just restart the memantine.

## 2021-10-02 ENCOUNTER — Encounter: Payer: Self-pay | Admitting: Family Medicine

## 2021-10-02 NOTE — Assessment & Plan Note (Signed)
Established problem. Stable. No decline in ADLs No behavioral issues   P/ Trial off memantine

## 2021-10-02 NOTE — Assessment & Plan Note (Signed)
Sharon Dodson had a fall within last few weeks. It occurred while she was leaning against and door when it was opened on the other side, and she fell onto her buttocks without injury.  Her husband immediately helped her to her feet.

## 2021-10-02 NOTE — Progress Notes (Signed)
Sharon Dodson is accompanied by daughter and husband Sources of clinical information for visit is/are patient, spouse/SO, and parent. Nursing assessment for this office visit was reviewed with the patient for accuracy and revision.     Previous Report(s) Reviewed: none  Depression screen PHQ 2/9 07/09/2021  Decreased Interest 0  Down, Depressed, Hopeless 0  PHQ - 2 Score 0  Altered sleeping 0  Tired, decreased energy 0  Change in appetite 0  Feeling bad or failure about yourself  0  Trouble concentrating 1  Moving slowly or fidgety/restless 0  Suicidal thoughts 0  PHQ-9 Score 1  Difficult doing work/chores Somewhat difficult  Some recent data might be hidden   Loss adjuster, chartered Office Visit from 07/09/2021 in [facility] Office Visit from 06/04/2021 in [facility] Office Visit from 05/05/2021 in [facility]  Thoughts that you would be better off dead, or of hurting yourself in some way Not at all Not at all Not at all  PHQ-9 Total Score 1 16 0       Fall Risk  10/01/2021 07/09/2021 06/04/2021 04/23/2021 01/08/2021  Falls in the past year? 1 1 1 1  0  Number falls in past yr: 0 1 0 1 0  Injury with Fall? 1 0 0 1 0  Risk for fall due to : - - - - -    PHQ9 SCORE ONLY 07/09/2021 06/04/2021 05/05/2021  PHQ-9 Total Score 1 16 0    Adult vaccines due  Topic Date Due   TETANUS/TDAP  03/12/2016    Health Maintenance Due  Topic Date Due   TETANUS/TDAP  03/12/2016      History/P.E. limitations: none  Adult vaccines due  Topic Date Due   TETANUS/TDAP  03/12/2016   There are no preventive care reminders to display for this patient.  Health Maintenance Due  Topic Date Due   TETANUS/TDAP  03/12/2016     Chief Complaint  Patient presents with   Follow-up

## 2021-10-02 NOTE — Assessment & Plan Note (Signed)
Established problem Well Controlled. Rate controlled with betablocker and on DOAC without bleeding.  No DOE above baseline.  No LE edema  patient's daughter reports that Dr Elease Hashimoto stated he did not need to see Mrs Bench more than once a year.  I will refill patient's BB and DOAC in future

## 2021-10-02 NOTE — Assessment & Plan Note (Addendum)
Established problem Well Controlled on reduced dose of olmesartan (history increase potassium) No signs of complications, medication side effects, or red flags. Continue current medications and other regiments.

## 2021-10-26 ENCOUNTER — Other Ambulatory Visit: Payer: Self-pay | Admitting: Cardiovascular Disease

## 2021-10-26 DIAGNOSIS — I4821 Permanent atrial fibrillation: Secondary | ICD-10-CM

## 2021-10-26 NOTE — Telephone Encounter (Signed)
Eliquis 5mg  refill request received. Patient is 88 years old, weight-72.6kg, Crea-0.84 on 07/09/2021, Diagnosis-Afib, and last seen by  Dr. 13/11/2020 on 05/18/2021. Dose is appropriate based on dosing criteria. Will send in refill to requested pharmacy.

## 2021-11-16 ENCOUNTER — Ambulatory Visit: Payer: Medicare Other | Admitting: Cardiovascular Disease

## 2021-12-16 ENCOUNTER — Other Ambulatory Visit: Payer: Self-pay | Admitting: Family Medicine

## 2021-12-16 DIAGNOSIS — J309 Allergic rhinitis, unspecified: Secondary | ICD-10-CM

## 2022-01-04 ENCOUNTER — Other Ambulatory Visit: Payer: Self-pay | Admitting: Family Medicine

## 2022-01-04 DIAGNOSIS — Z8673 Personal history of transient ischemic attack (TIA), and cerebral infarction without residual deficits: Secondary | ICD-10-CM

## 2022-01-04 DIAGNOSIS — E7800 Pure hypercholesterolemia, unspecified: Secondary | ICD-10-CM

## 2022-01-21 ENCOUNTER — Other Ambulatory Visit: Payer: Self-pay | Admitting: Family Medicine

## 2022-01-21 DIAGNOSIS — M810 Age-related osteoporosis without current pathological fracture: Secondary | ICD-10-CM

## 2022-02-09 ENCOUNTER — Encounter: Payer: Self-pay | Admitting: *Deleted

## 2022-02-13 ENCOUNTER — Emergency Department (HOSPITAL_COMMUNITY): Payer: Medicare Other

## 2022-02-13 ENCOUNTER — Other Ambulatory Visit: Payer: Self-pay

## 2022-02-13 ENCOUNTER — Encounter (HOSPITAL_COMMUNITY): Payer: Self-pay

## 2022-02-13 ENCOUNTER — Emergency Department (HOSPITAL_COMMUNITY)
Admission: EM | Admit: 2022-02-13 | Discharge: 2022-02-13 | Disposition: A | Discharge status: Home / Self Care | Payer: Medicare Other | Attending: Emergency Medicine | Admitting: Emergency Medicine

## 2022-02-13 DIAGNOSIS — K2100 Gastro-esophageal reflux disease with esophagitis, without bleeding: Secondary | ICD-10-CM | POA: Insufficient documentation

## 2022-02-13 DIAGNOSIS — Z7901 Long term (current) use of anticoagulants: Secondary | ICD-10-CM | POA: Diagnosis not present

## 2022-02-13 DIAGNOSIS — R791 Abnormal coagulation profile: Secondary | ICD-10-CM | POA: Insufficient documentation

## 2022-02-13 DIAGNOSIS — R42 Dizziness and giddiness: Secondary | ICD-10-CM | POA: Diagnosis present

## 2022-02-13 DIAGNOSIS — R4182 Altered mental status, unspecified: Secondary | ICD-10-CM | POA: Insufficient documentation

## 2022-02-13 DIAGNOSIS — K219 Gastro-esophageal reflux disease without esophagitis: Secondary | ICD-10-CM

## 2022-02-13 LAB — OCCULT BLOOD GASTRIC / DUODENUM (SPECIMEN CUP): Occult Blood, Gastric: POSITIVE — AB

## 2022-02-13 LAB — COMPREHENSIVE METABOLIC PANEL
ALT: 20 U/L (ref 0–44)
AST: 28 U/L (ref 15–41)
Albumin: 3.8 g/dL (ref 3.5–5.0)
Alkaline Phosphatase: 64 U/L (ref 38–126)
Anion gap: 10 (ref 5–15)
BUN: 25 mg/dL — ABNORMAL HIGH (ref 8–23)
CO2: 25 mmol/L (ref 22–32)
Calcium: 9.7 mg/dL (ref 8.9–10.3)
Chloride: 103 mmol/L (ref 98–111)
Creatinine, Ser: 0.97 mg/dL (ref 0.44–1.00)
GFR, Estimated: 56 mL/min — ABNORMAL LOW (ref >60)
Glucose, Bld: 95 mg/dL (ref 70–99)
Potassium: 4.2 mmol/L (ref 3.5–5.1)
Sodium: 138 mmol/L (ref 135–145)
Total Bilirubin: 1.3 mg/dL — ABNORMAL HIGH (ref 0.3–1.2)
Total Protein: 6.8 g/dL (ref 6.5–8.1)

## 2022-02-13 LAB — PROTIME-INR
INR: 1.3 — ABNORMAL HIGH (ref 0.8–1.2)
Prothrombin Time: 15.7 seconds — ABNORMAL HIGH (ref 11.4–15.2)

## 2022-02-13 LAB — URINALYSIS, ROUTINE W REFLEX MICROSCOPIC
Bilirubin Urine: NEGATIVE
Glucose, UA: NEGATIVE mg/dL
Hgb urine dipstick: NEGATIVE
Ketones, ur: NEGATIVE mg/dL
Nitrite: NEGATIVE
Protein, ur: NEGATIVE mg/dL
Specific Gravity, Urine: 1.02 (ref 1.005–1.030)
WBC, UA: >50 WBC/hpf — ABNORMAL HIGH (ref 0–5)
pH: 5 (ref 5.0–8.0)

## 2022-02-13 LAB — CBC
HCT: 44.9 % (ref 36.0–46.0)
Hemoglobin: 15.2 g/dL — ABNORMAL HIGH (ref 12.0–15.0)
MCH: 33.3 pg (ref 26.0–34.0)
MCHC: 33.9 g/dL (ref 30.0–36.0)
MCV: 98.5 fL (ref 80.0–100.0)
Platelets: 210 10*3/uL (ref 150–400)
RBC: 4.56 MIL/uL (ref 3.87–5.11)
RDW: 13.1 % (ref 11.5–15.5)
WBC: 6.3 10*3/uL (ref 4.0–10.5)
nRBC: 0 % (ref 0.0–0.2)

## 2022-02-13 LAB — TROPONIN I (HIGH SENSITIVITY): Troponin I (High Sensitivity): 19 ng/L — ABNORMAL HIGH (ref <18)

## 2022-02-13 NOTE — ED Provider Notes (Signed)
[facility] EMERGENCY DEPARTMENT Provider Note   CSN: [id number] Arrival date & time: 02/13/22  1518     History  No chief complaint on file.   Sharon Dodson is a 88 y.o. female.  HPI He is here for evaluation of dizziness which occurred as she was sitting in a card table.  She was transferred by EMS for evaluation.  She did not lose consciousness.  No ongoing symptoms.  No recent illnesses including fever, cough, shortness of breath, chest pain, active vomiting or diarrhea.  While in the waiting room she spit up some brown material.  This reoccurred during physical examination.    Home Medications Prior to Admission medications   Medication Sig Start Date End Date Taking? Authorizing Provider  alendronate (FOSAMAX) 70 MG tablet TAKE 1 TABLET EVERY 7 DAYS WITH A FULL GLASS OF WATER ON AN EMPTY STOMACH 01/21/22   McDiarmid, Blane Ohara, MD  apixaban (ELIQUIS) 5 MG TABS tablet TAKE 1 TABLET TWICE A DAY 10/26/21   Nahser, Wonda Cheng, MD  calcium-vitamin D (OSCAL 500/200 D-3) 500-200 MG-UNIT tablet Take 2 tablets by mouth daily with breakfast. 10/03/20   McDiarmid, Blane Ohara, MD  Cyanocobalamin (VITAMIN B-12 PO) Take 1 tablet by mouth daily.     [provider]  DULoxetine (CYMBALTA) 20 MG capsule TAKE 1 CAPSULE DAILY 05/26/21   McDiarmid, Blane Ohara, MD  loratadine (CLARITIN) 10 MG tablet TAKE 1 TABLET DAILY (STOP CLARINEX PRESCRIPTION) 12/16/21   McDiarmid, Blane Ohara, MD  Multiple Vitamins-Minerals (PRESERVISION AREDS 2) CAPS Take 1 capsule by mouth daily.    [provider]  olmesartan (BENICAR) 20 MG tablet Take 0.5 tablets (10 mg total) by mouth daily. 06/04/21   McDiarmid, Blane Ohara, MD  pravastatin (PRAVACHOL) 80 MG tablet TAKE 1 TABLET DAILY 01/04/22   McDiarmid, Blane Ohara, MD  triamcinolone ointment (KENALOG) 0.1 % APPLY TOPICALLY TO THE AFFECTED AREA TWICE DAILY 06/17/21   McDiarmid, Blane Ohara, MD      Allergies    Sulfa antibiotics, Sulfasalazine, and Hydrocodone    Review  of Systems   Review of Systems  Physical Exam Updated Vital Signs BP (!) 160/90 (BP Location: Right Arm)   Pulse 60   Temp 98.6 F (37 C) (Oral)   Resp 16   SpO2 97%  Physical Exam Vitals and nursing note reviewed.  Constitutional:      Appearance: She is well-developed. She is obese. She is not ill-appearing, toxic-appearing or diaphoretic.  HENT:     Head: Normocephalic and atraumatic.     Right Ear: External ear normal.     Left Ear: External ear normal.  Eyes:     Conjunctiva/sclera: Conjunctivae normal.     Pupils: Pupils are equal, round, and reactive to light.  Neck:     Trachea: Phonation normal.  Cardiovascular:     Rate and Rhythm: Normal rate and regular rhythm.     Heart sounds: Normal heart sounds.  Pulmonary:     Effort: Pulmonary effort is normal.     Breath sounds: Normal breath sounds.  Abdominal:     Palpations: Abdomen is soft.     Tenderness: There is no abdominal tenderness.     Comments: She spit up with a small amount of fluid, after sitting up.  I suspect that this was from reflux.  Musculoskeletal:        General: Normal range of motion.     Cervical back: Normal range of motion and neck supple.  Skin:    General: Skin is warm and dry.  Neurological:     Mental Status: She is alert and oriented to person, place, and time.     Cranial Nerves: No cranial nerve deficit.     Sensory: No sensory deficit.     Motor: No abnormal muscle tone.     Coordination: Coordination normal.     Comments: No dysarthria, aphasia or nystagmus.  No ataxia.  Psychiatric:        Mood and Affect: Mood normal.        Behavior: Behavior normal.        Thought Content: Thought content normal.        Judgment: Judgment normal.     ED Results / Procedures / Treatments   Labs (all labs ordered are listed, but only abnormal results are displayed) Labs Reviewed  COMPREHENSIVE METABOLIC PANEL - Abnormal; Notable for the following components:      Result Value   BUN  25 (*)    Total Bilirubin 1.3 (*)    GFR, Estimated 56 (*)    All other components within normal limits  CBC - Abnormal; Notable for the following components:   Hemoglobin 15.2 (*)    All other components within normal limits  PROTIME-INR - Abnormal; Notable for the following components:   Prothrombin Time 15.7 (*)    INR 1.3 (*)    All other components within normal limits  URINALYSIS, ROUTINE W REFLEX MICROSCOPIC - Abnormal; Notable for the following components:   Color, Urine AMBER (*)    APPearance CLOUDY (*)    Leukocytes,Ua MODERATE (*)    WBC, UA >50 (*)    Bacteria, UA RARE (*)    All other components within normal limits  OCCULT BLOOD GASTRIC / DUODENUM (SPECIMEN CUP) - Abnormal; Notable for the following components:   Occult Blood, Gastric POSITIVE (*)    All other components within normal limits  TROPONIN I (HIGH SENSITIVITY) - Abnormal; Notable for the following components:   Troponin I (High Sensitivity) 19 (*)    All other components within normal limits  URINE CULTURE  TROPONIN I (HIGH SENSITIVITY)    EKG EKG Interpretation  Date/Time:  Saturday February 13 2022 15:25:20 EDT Ventricular Rate:  74 PR Interval:    QRS Duration: 88 QT Interval:  370 QTC Calculation: 410 R Axis:   30 Text Interpretation: Atrial fibrillation Low voltage QRS Cannot rule out Anterior infarct , age undetermined Abnormal ECG When compared with ECG of 23-May-2021 12:59, PREVIOUS ECG IS PRESENT since last tracing no significant change Confirmed by Daleen Bo [phone]) on 02/13/2022 7:09:06 PM  Radiology CT HEAD WO CONTRAST (5MM)  Result Date: 02/13/2022 CLINICAL DATA:  Dizziness EXAM: CT HEAD WITHOUT CONTRAST TECHNIQUE: Contiguous axial images were obtained from the base of the skull through the vertex without intravenous contrast. RADIATION DOSE REDUCTION: This exam was performed according to the departmental dose-optimization program which includes automated exposure control, adjustment  of the mA and/or kV according to patient size and/or use of iterative reconstruction technique. COMPARISON:  CT head 05/23/2021 FINDINGS: Brain: No acute intracranial hemorrhage, mass effect, or herniation. No extra-axial fluid collections. No evidence of acute territorial infarct. No hydrocephalus. 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 Moderate cortical volume loss. Patchy hypodensities throughout the periventricular and subcortical white matter, likely secondary to advanced chronic microvascular ischemic changes. Vascular: No hyperdense vessel or unexpected calcification. Skull: Normal. Negative for fracture or focal lesion. Sinuses/Orbits: No acute finding. Other:  None. IMPRESSION: No acute intracranial process identified. Chronic changes as described. Electronically Signed   By: Ofilia Neas M.D.   On: 02/13/2022 16:38   DG Chest 2 View  Result Date: 02/13/2022 CLINICAL DATA:  Dizziness. EXAM: CHEST - 2 VIEW COMPARISON:  05/23/2021 FINDINGS: The lungs are clear without focal pneumonia, edema, pneumothorax or pleural effusion. Similar bibasilar chronic atelectasis or scarring. The cardio pericardial silhouette is enlarged. Probable hiatal hernia. Bones are diffusely demineralized. IMPRESSION: Cardiomegaly with bibasilar chronic atelectasis or scarring. No acute cardiopulmonary findings. Electronically Signed   By: Misty Stanley M.D.   On: 02/13/2022 16:13    Procedures Procedures    Medications Ordered in ED Medications - No data to display  ED Course/ Medical Decision Making/ A&P                           Medical Decision Making Patient being evaluated for dizziness which occurred as she was sitting at a card table.  She has also had some reflux type symptoms with spitting up of a small amount of material since the incident.  She has previously been treated with a PPI but is not currently taking it.  No ongoing cardiac symptoms or treatment.  She has history of dementia, GERD, atrial  fibrillation, chronic anticoagulation and ventral hernia.  Problems Addressed: Dizziness: acute illness or injury with systemic symptoms    Details: Nonspecific symptoms. Gastroesophageal reflux disease, unspecified whether esophagitis present: chronic illness or injury    Details: Currently not being treated.  Amount and/or Complexity of Data Reviewed Independent Historian: caregiver    Details: Caregiver, daughter, assist patient with history.  She lives with the patient. External Data Reviewed: notes.    Details: Outpatient PCP monitoring for dementia, office visit January 2023 Labs: ordered.    Details: CBC, metabolic panel, INR, urinalysis, gastric occult blood-normal except gastric occult blood is positive, BUN high, total bilirubin high, GFR low, hemoglobin high, INR elevated, nonspecific finding, patient treated with a NOAC Radiology: ordered and independent interpretation performed.    Details: CT head, no mass or bleeding.  Considered MRI of the brain but decided not to order it because she did not have persistent dizziness. ECG/medicine tests: ordered and independent interpretation performed.    Details: Cardiac monitor, atrial fibrillation  Risk Decision regarding hospitalization. Risk Details: Patient with nonspecific dizziness, CT negative for stroke.  No persistent symptoms requiring advanced imaging.  Symptoms complicated by gastroesophageal reflux, with a small amount of blood, and material that she spit up. doubt active GI bleeding.  She is hemodynamically stable.  Doubt CVA or significant metabolic instability.  She is stable for discharge.  She does not require hospitalization           Final Clinical Impression(s) / ED Diagnoses Final diagnoses:  Dizziness  Gastroesophageal reflux disease, unspecified whether esophagitis present    Rx / DC Orders ED Discharge Orders     None         Daleen Bo, MD 02/14/22 1418

## 2022-02-13 NOTE — ED Notes (Signed)
Discharge instructions reviewed with patient. Patient verbalized understanding of instructions. Follow-up care and medications were reviewed. Patient ambulatory with steady gait. VSS upon discharge.  ?

## 2022-02-13 NOTE — Discharge Instructions (Signed)
There were no serious problems found from the episode of dizziness.  She may have reflux, causing some bleeding or just simply material being produced from the stomach.  Restart Prilosec (omeprazole), once a day until you see your doctor.  Return here for further problems or concerns.

## 2022-02-13 NOTE — ED Notes (Signed)
Pt spit up brown vomit on blanket.

## 2022-02-13 NOTE — ED Provider Triage Note (Addendum)
Emergency Medicine Provider Triage Evaluation Note  Sharon Dodson , a 88 y.o. female  was evaluated in triage.  Pt complains of dizziness which began while she was playing cards, reports she was trying to stand when she felt very dizzy.  She is unsure what brought her up here today.  She is not complaining of any pain but was given aspirin by EMS.  When asked questions patient is very unsure on the reason why she is here.  She is A&O x4.  Review of Systems  Positive: Dizziness Negative: Nausea, vomiting, chest pain, abdominal pain  Physical Exam  BP (!) 141/84 (BP Location: Right Arm)   Pulse (!) 112   Temp 98 F (36.7 C) (Oral)   Resp 17   SpO2 96%  Gen:   Awake, no distress   Resp:  Normal effort  MSK:   Moves extremities without difficulty  Other:  Weaker to the left upper extremity, reports prior injury to the left arm.  No facial asymmetry, no dysarthria.  Medical Decision Making  Medically screening exam initiated at 3:30 PM.  Appropriate orders placed.  Sharon Dodson was informed that the remainder of the evaluation will be completed by another provider, this initial triage assessment does not replace that evaluation, and the importance of remaining in the ED until their evaluation is complete.     Janeece Fitting, PA-C 02/13/22 [place], Isreal Moline, PA-C 02/13/22 1532

## 2022-02-13 NOTE — ED Triage Notes (Signed)
Patient arrived by [facility] from home with complaint of dizziness that started acutely today while playing cards. Patient states that the symptoms started around 2pm and describes as positional. Patient alert but confused initially to month. Maex4,

## 2022-02-14 ENCOUNTER — Encounter: Payer: Self-pay | Admitting: Family Medicine

## 2022-02-15 ENCOUNTER — Encounter: Payer: Self-pay | Admitting: Family Medicine

## 2022-02-17 LAB — URINE CULTURE: Culture: >=100000 — AB

## 2022-02-18 ENCOUNTER — Telehealth: Payer: Self-pay | Admitting: *Deleted

## 2022-02-18 NOTE — Progress Notes (Signed)
ED Antimicrobial Stewardship Positive Culture Follow Up   Sharon Dodson is an 88 y.o. female who presented to [facility] on 02/13/2022 with a chief complaint of No chief complaint on file.   Recent Results (from the past 720 hour(s))  Urine Culture     Status: Abnormal   Collection Time: 02/13/22  7:22 PM   Specimen: Urine, Clean Catch  Result Value Ref Range Status   Specimen Description URINE, CLEAN CATCH  Final   Special Requests   Final    NONE Performed at [facility], [address], [place], [place] [address]    Culture >=100,000 COLONIES/mL ESCHERICHIA COLI (A)  Final   Report Status 02/17/2022 FINAL  Final   Organism ID, Bacteria ESCHERICHIA COLI (A)  Final      Susceptibility   Escherichia coli - MIC*    AMPICILLIN >=32 RESISTANT Resistant     CEFAZOLIN <=4 SENSITIVE Sensitive     CEFEPIME <=0.12 SENSITIVE Sensitive     CEFTRIAXONE <=0.25 SENSITIVE Sensitive     CIPROFLOXACIN <=0.25 SENSITIVE Sensitive     GENTAMICIN >=16 RESISTANT Resistant     IMIPENEM 0.5 SENSITIVE Sensitive     NITROFURANTOIN <=16 SENSITIVE Sensitive     TRIMETH/SULFA >=320 RESISTANT Resistant     AMPICILLIN/SULBACTAM 16 INTERMEDIATE Intermediate     PIP/TAZO <=4 SENSITIVE Sensitive     * >=100,000 COLONIES/mL ESCHERICHIA COLI    [x]  Patient discharged originally without antimicrobial agent and treatment is now indicated  New antibiotic prescription: cephalexin 500mg  by mouth every q12h hours x 5 days  ED Provider: , PA-C   02/18/2022, 9:08 AM Clinical Pharmacist Monday - Friday phone -  [phone] Saturday - Sunday phone - [phone]

## 2022-02-18 NOTE — Telephone Encounter (Signed)
Post ED Visit - Positive Culture Follow-up: Successful Patient Follow-Up  Culture assessed and recommendations reviewed by:  []  , Pharm.D. []  Enzo Bi, Pharm.D., BCPS AQ-ID []  , Pharm.D., BCPS []  Celedonio Miyamoto, Pharm.D., BCPS []  [place], Garvin Fila.D., BCPS, AAHIVP []  , Pharm.D., BCPS, AAHIVP []  Georgina Pillion, PharmD, BCPS []  , PharmD, BCPS []  [place], PharmD, BCPS []  [address], PharmD  Positive urine culture  [x]  Patient discharged without antimicrobial prescription and treatment is now indicated []  Organism is resistant to prescribed ED discharge antimicrobial []  Patient with positive blood cultures  Changes discussed with ED provider: , PA-C New antibiotic prescription Cephalexin 500mg  PO q12 hors x 5 days Called to Estella Husk   Contacted daughter of patient, date 02/18/2022, time 0920   02/18/2022, 9:22 AM

## 2022-03-18 ENCOUNTER — Encounter: Payer: Self-pay | Admitting: Family Medicine

## 2022-03-18 ENCOUNTER — Ambulatory Visit (INDEPENDENT_AMBULATORY_CARE_PROVIDER_SITE_OTHER): Payer: Medicare Other | Admitting: Family Medicine

## 2022-03-18 VITALS — BP 115/82 | HR 94 | Ht 62.0 in | Wt 172.1 lb

## 2022-03-18 DIAGNOSIS — R42 Dizziness and giddiness: Secondary | ICD-10-CM | POA: Diagnosis not present

## 2022-03-18 DIAGNOSIS — I10 Essential (primary) hypertension: Secondary | ICD-10-CM

## 2022-03-18 MED ORDER — OLMESARTAN MEDOXOMIL 5 MG PO TABS: 5.0000 mg | ORAL_TABLET | Freq: Every day | ORAL | 3 refills | Status: DC

## 2022-03-18 NOTE — Progress Notes (Signed)
Sharon Dodson is accompanied by daughter, [name] Sources of clinical information for visit is/are patient, relative(s), and past medical records. Nursing assessment for this office visit was reviewed with the patient for accuracy and revision.     Previous Report(s) Reviewed: ER records     03/18/2022   10:17 AM  Depression screen PHQ 2/9  Decreased Interest 0  Down, Depressed, Hopeless 0  PHQ - 2 Score 0  Altered sleeping 0  Tired, decreased energy 0  Change in appetite 0  Feeling bad or failure about yourself  0  Trouble concentrating 0  Moving slowly or fidgety/restless 0  Suicidal thoughts 0  PHQ-9 Score 0   Flowsheet Row Office Visit from 03/18/2022 in [facility] Office Visit from 07/09/2021 in [facility] Office Visit from 06/04/2021 in [facility]  Thoughts that you would be better off dead, or of hurting yourself in some way Not at all Not at all Not at all  PHQ-9 Total Score 0 1 16          10/01/2021    1:50 PM 07/09/2021   11:24 AM 06/04/2021    9:49 AM 04/23/2021   10:27 AM 01/08/2021   10:48 AM  Fall Risk   Falls in the past year? 1 1 1 1  0  Number falls in past yr: 0 1 0 1 0  Injury with Fall? 1 0 0 1 0       03/18/2022   10:17 AM 07/09/2021   11:24 AM 06/04/2021    9:49 AM  PHQ9 SCORE ONLY  PHQ-9 Total Score 0 1 16    Adult vaccines due  Topic Date Due   TETANUS/TDAP  03/12/2016    Health Maintenance Due  Topic Date Due   TETANUS/TDAP  03/12/2016      History/P.E. limitations: dementia  Adult vaccines due  Topic Date Due   TETANUS/TDAP  03/12/2016   There are no preventive care reminders to display for this patient.  Health Maintenance Due  Topic Date Due   TETANUS/TDAP  03/12/2016     Chief Complaint  Patient presents with   Follow-up

## 2022-03-18 NOTE — Patient Instructions (Signed)
Your blood pressure is in the low normal range.  Please decrease the dose of your olmesartan (Benicar) to a 5 mg tablet one tablet at night.   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  We usually do not remove them unless they are causing irritation or are unsightly.

## 2022-03-19 ENCOUNTER — Encounter: Payer: Self-pay | Admitting: Family Medicine

## 2022-03-19 DIAGNOSIS — R42 Dizziness and giddiness: Secondary | ICD-10-CM

## 2022-03-19 HISTORY — DX: Dizziness and giddiness: R42

## 2022-03-19 NOTE — Assessment & Plan Note (Signed)
Established problem Episode of lightheadedness requiring ED visit.   Plan Decrease olmesartan to 5 mg tablet, one tablet at bedtime RTC 1 month for recheck BP and symptoms.

## 2022-03-19 NOTE — Assessment & Plan Note (Signed)
Established problem Follow up of ED visit 02/13/22 for orthostatic dizziness Dizziness occurred after standing from seated position Transient event-no vertigo-no full LOC  Head CT without acute abnormality Urine with WBC > 50 without localizing UTI sympoms. Tx'd with Keflex by EDP  No recurrence.  Today's Vitals   03/18/22 1018  BP: 115/82  Pulse: 94  SpO2: 97%  Weight: 172 lb 2 oz (78.1 kg)  Height: 5\' 2"  (1.575 m)  PainSc: 0-No pain   Body mass index is 31.48 kg/m.   Transient dizziness would be an unusual presentation of UTI.  Will decrease olmesartan to 5 mg tablet, one tab at bedtime fromcurrent 10 mg at bedtime dose.  Titrate to standing BP. Goal SBP < 140/90

## 2022-04-10 ENCOUNTER — Encounter: Payer: Self-pay | Admitting: Family Medicine

## 2022-04-10 ENCOUNTER — Other Ambulatory Visit: Payer: Self-pay | Admitting: Family Medicine

## 2022-04-10 DIAGNOSIS — I10 Essential (primary) hypertension: Secondary | ICD-10-CM

## 2022-04-10 DIAGNOSIS — G301 Alzheimer's disease with late onset: Secondary | ICD-10-CM

## 2022-04-13 ENCOUNTER — Telehealth: Payer: Self-pay

## 2022-04-13 ENCOUNTER — Other Ambulatory Visit (HOSPITAL_COMMUNITY): Payer: Self-pay

## 2022-04-13 MED ORDER — OLMESARTAN MEDOXOMIL 5 MG PO TABS
5.0000 mg | ORAL_TABLET | Freq: Every day | ORAL | 0 refills | Status: DC
Start: 2022-04-13 — End: 2022-04-23

## 2022-04-13 NOTE — Telephone Encounter (Signed)
Patient Advocate Encounter   Received notification that prior authorization for Olmesartan 5 mg is required.   PA submitted on 04/13/2022 Key [id number] Status is pending

## 2022-04-19 NOTE — Telephone Encounter (Signed)
Prior Auth for patients medication OLMESARTAN approved by [employer] from 04/13/22 to 09/05/2098 (??).  Key: [id number]

## 2022-04-22 ENCOUNTER — Ambulatory Visit (INDEPENDENT_AMBULATORY_CARE_PROVIDER_SITE_OTHER): Payer: Medicare Other | Admitting: Family Medicine

## 2022-04-22 ENCOUNTER — Encounter: Payer: Self-pay | Admitting: Family Medicine

## 2022-04-22 VITALS — BP 112/68 | HR 70 | Wt 172.0 lb

## 2022-04-22 DIAGNOSIS — M810 Age-related osteoporosis without current pathological fracture: Secondary | ICD-10-CM

## 2022-04-22 DIAGNOSIS — E7800 Pure hypercholesterolemia, unspecified: Secondary | ICD-10-CM | POA: Diagnosis not present

## 2022-04-22 DIAGNOSIS — I4821 Permanent atrial fibrillation: Secondary | ICD-10-CM

## 2022-04-22 DIAGNOSIS — I10 Essential (primary) hypertension: Secondary | ICD-10-CM

## 2022-04-22 DIAGNOSIS — G301 Alzheimer's disease with late onset: Secondary | ICD-10-CM | POA: Diagnosis not present

## 2022-04-22 NOTE — Patient Instructions (Signed)
Your blood pressure looks very good, even without blood pressure medications.  Please make sure you are not taking any olmesartan at all.   Keep taking your blood thinner Eliquis and your alendronate weekly.   It is fine to take the memantine and you may try trials off it , if you wish.  Please continue taking your duloxetine.

## 2022-04-23 ENCOUNTER — Encounter: Payer: Self-pay | Admitting: Family Medicine

## 2022-04-23 NOTE — Assessment & Plan Note (Signed)
Established problem. Stable. Patient is at goal of no overt bleeding nor findings of new stroke nor systemic embolism. No beta-blocker required for rate control.. No signs of complications, medication side effects, or red flags. Continue current medications and other regiments.

## 2022-04-23 NOTE — Assessment & Plan Note (Signed)
Established problem Well Controlled and is at goal of <130/80. Sharon Dodson has been out of her olmesartan for several weeks.  Her home BP log shows most readings < 120/80 without any below 110 SBP.  Recommend stopping the olmesartan completely and continue monitoring home BP

## 2022-04-23 NOTE — Assessment & Plan Note (Signed)
Adequate symptom control. Tolerating pravastatin medication. Continue current medication regiment.

## 2022-04-23 NOTE — Assessment & Plan Note (Addendum)
Established problem. Stable. Able to dress, bath, transfer and ambulate without assistance.  Walking ~ half-mile aday in halls of their [facility] using walker. Uses walker because of balance impairment.  No behavior issues.  Currently taking memantine. Discussed with patient and daughter that they may perform a trial off the medication to see if there is a change.  If there is are deleterious changes, they could restart the memantine.  Plan to continue the duloxetine 20 mg daily as it helps reduce patient's crying episodes.

## 2022-04-23 NOTE — Progress Notes (Signed)
Sharon Dodson is accompneid by her husband and daughter  Sources of clinical information for visit is/are patient and dgt Nursing assessment for this office visit was reviewed with the patient for accuracy and revision.     Previous Report(s) Reviewed: ER records     04/22/2022    2:24 PM  Depression screen PHQ 2/9  Decreased Interest 0  Down, Depressed, Hopeless 0  PHQ - 2 Score 0   Flowsheet Row Office Visit from 03/18/2022 in [facility] Office Visit from 07/09/2021 in [facility] Office Visit from 06/04/2021 in [facility]  Thoughts that you would be better off dead, or of hurting yourself in some way Not at all Not at all Not at all  PHQ-9 Total Score 0 1 16          04/22/2022    2:24 PM 10/01/2021    1:50 PM 07/09/2021   11:24 AM 06/04/2021    9:49 AM 04/23/2021   10:27 AM  Fall Risk   Falls in the past year? 1 1 1 1 1   Number falls in past yr:  0 1 0 1  Injury with Fall?  1 0 0 1       04/22/2022    2:24 PM 03/18/2022   10:17 AM 07/09/2021   11:24 AM  PHQ9 SCORE ONLY  PHQ-9 Total Score 0 0 1    Adult vaccines due  Topic Date Due   TETANUS/TDAP  03/12/2016    Health Maintenance Due  Topic Date Due   TETANUS/TDAP  03/12/2016   COVID-19 Vaccine (6 - Pfizer risk series) 07/14/2021   INFLUENZA VACCINE  04/06/2022      History/P.E. limitations: dementia  Adult vaccines due  Topic Date Due   TETANUS/TDAP  03/12/2016   There are no preventive care reminders to display for this patient.  Health Maintenance Due  Topic Date Due   TETANUS/TDAP  03/12/2016   COVID-19 Vaccine (6 - Pfizer risk series) 07/14/2021   INFLUENZA VACCINE  04/06/2022     Chief Complaint  Patient presents with   Blood Pressure Check     --------------------------------------------------------------------------------------------------------------------------------------------- Visit Problem List with A/P  Alzheimer's  disease with late onset (HCC) Established problem. Stable. Able to dress, bath, transfer and ambulate without assistance.  Walking ~ half-mile aday in halls of their [facility] using walker. Uses walker because of balance impairment.  No behavior issues.  Currently taking memantine. Discussed with patient and daughter that they may perform a trial off the medication to see if there is a change.  If there is are deleterious changes, they could restart the memantine.  Plan to continue the duloxetine 20 mg daily as it helps reduce patient's crying episodes.     Atrial fibrillation, permanent (HCC) Established problem. Stable. Patient is at goal of no overt bleeding nor findings of new stroke nor systemic embolism. No beta-blocker required for rate control.. No signs of complications, medication side effects, or red flags. Continue current medications and other regiments.   Benign essential HTN Established problem Well Controlled and is at goal of <130/80. Mrs Broadfoot has been out of her olmesartan for several weeks.  Her home BP log shows most readings < 120/80 without any below 110 SBP.  Recommend stopping the olmesartan completely and continue monitoring home BP  Hypercholesterolemia Adequate symptom control. Tolerating pravastatin medication. Continue current medication regiment.   Osteoporosis No fractures.  Tolerating alendronate 70 mg weekly medication. Continue  current medication regiment. Will continue for 5 years, ~ 03/2026.

## 2022-04-23 NOTE — Assessment & Plan Note (Addendum)
No fractures.  Tolerating alendronate 70 mg weekly medication. Continue current medication regiment. Will continue for 5 years, ~ 03/2026.

## 2022-05-20 ENCOUNTER — Other Ambulatory Visit: Payer: Self-pay | Admitting: Family Medicine

## 2022-05-20 DIAGNOSIS — I10 Essential (primary) hypertension: Secondary | ICD-10-CM

## 2022-06-21 ENCOUNTER — Other Ambulatory Visit: Payer: Self-pay | Admitting: Family Medicine

## 2022-08-05 IMAGING — CT CT HEAD W/O CM
4 series · 15 of 47 positions shown, 17 images · non-contrast
Comparison: None.

CLINICAL DATA: Increased weakness for the past 2 days with multiple
falls.

EXAM:
CT HEAD WITHOUT CONTRAST
CT CERVICAL SPINE WITHOUT CONTRAST
TECHNIQUE: Multidetector CT imaging of the head and cervical spine was
performed following the standard protocol without intravenous
contrast. Multiplanar CT image reconstructions of the cervical spine
were also generated.

[Series 2: head wo · axial · 0.42mm/px · z∈[-298,-188]mm · 7 of 30 slices shown, 9 images]
[im 4/30  brain]
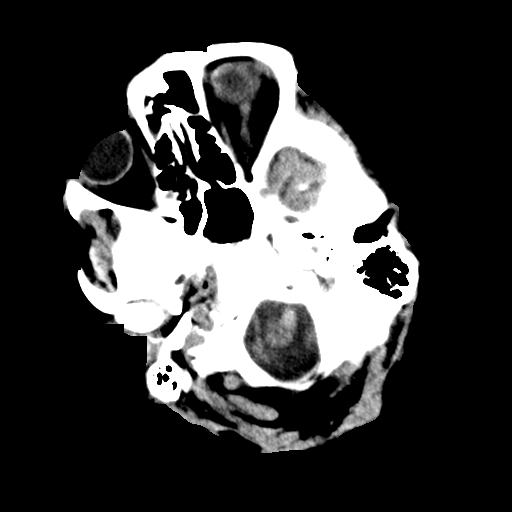
[im 4/30  bone]
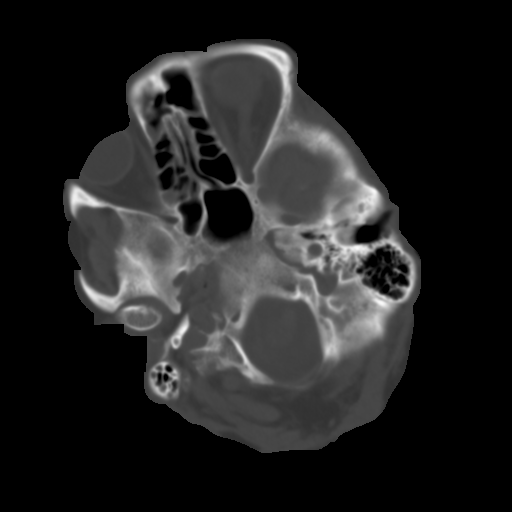
[im 8/30  brain]
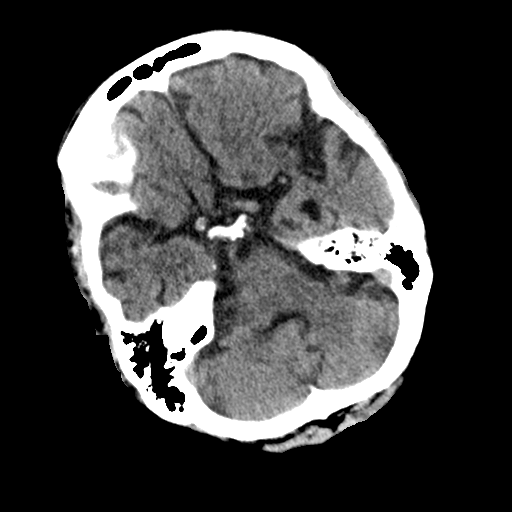
[im 11/30  brain]
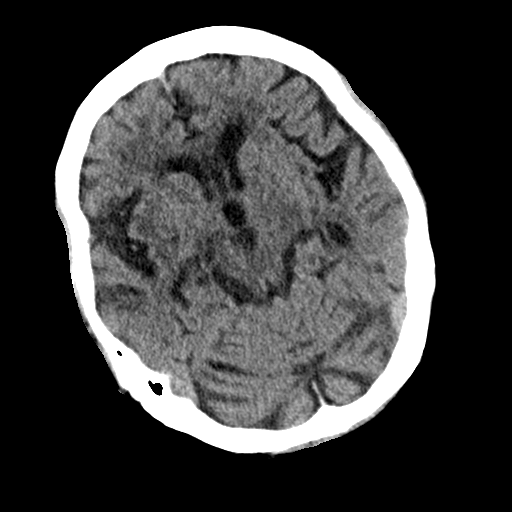
[im 15/30  brain]
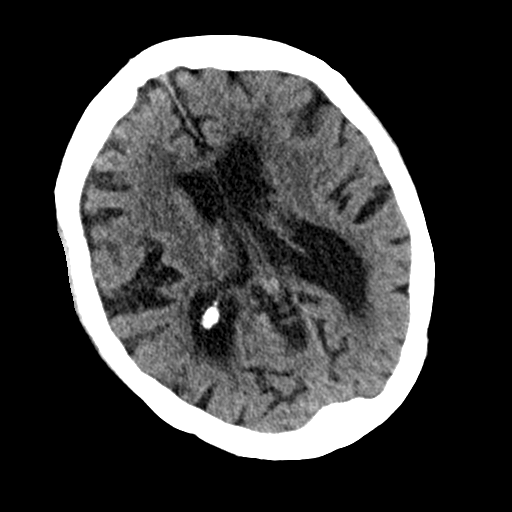
[im 19/30  brain]
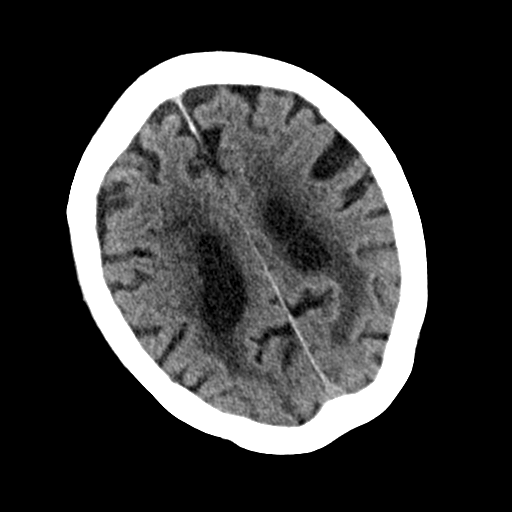
[im 19/30  bone]
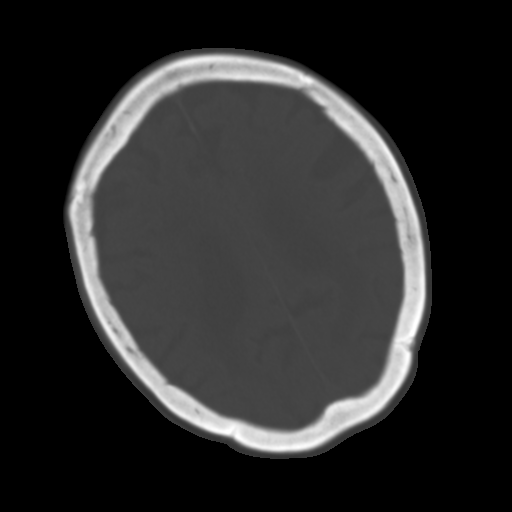
[im 22/30  brain]
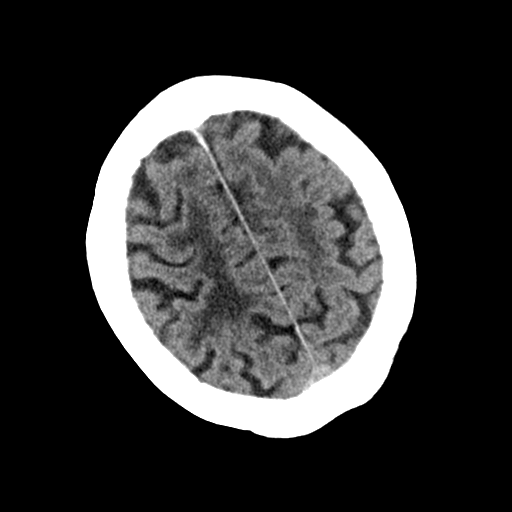
[im 26/30  brain]
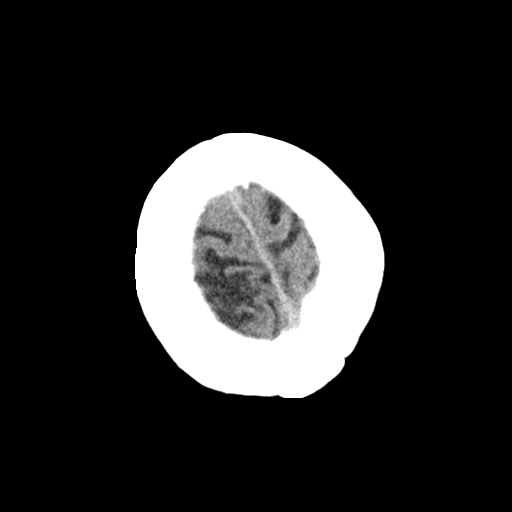

[Series 3: head bone · axial · 0.42mm/px · z∈[-299,-285]mm · 2 of 75 slices shown]
[im 8/75  bone]
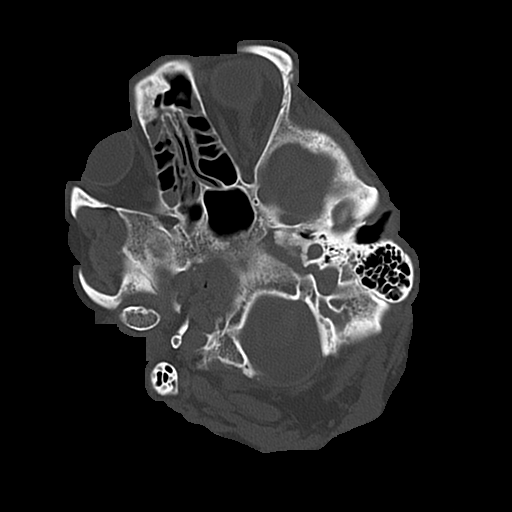
[im 15/75  bone]
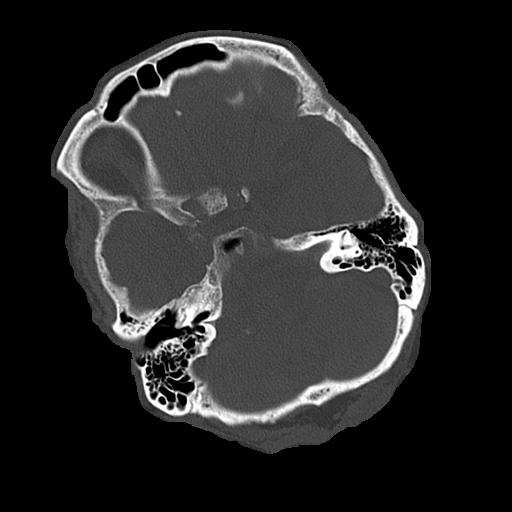

[Series 4: coronal soft · coronal · 0.30mm/px · 3 of 66 slices shown]
[im 22/66  brain]
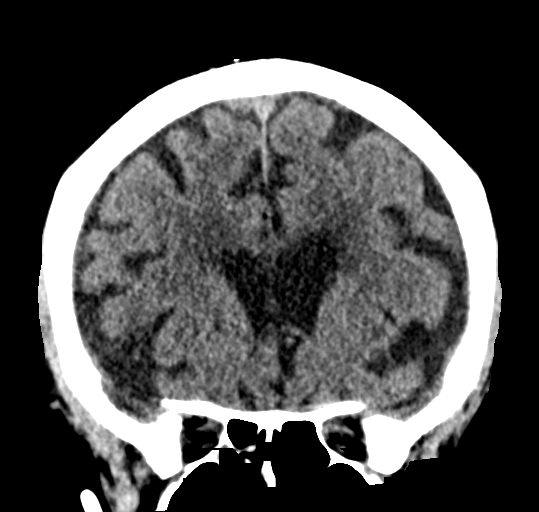
[im 29/66  brain]
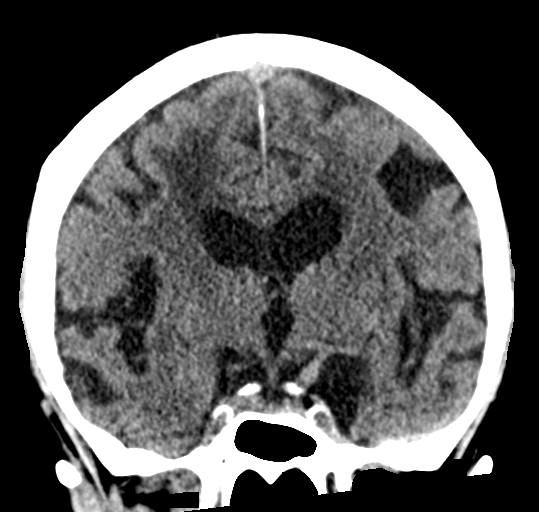
[im 37/66  brain]
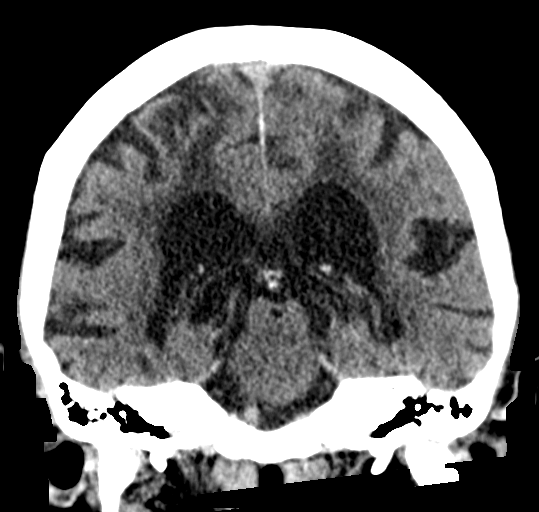

[Series 5: sagittal soft · sagittal · 0.31mm/px · 3 of 54 slices shown]
[im 18/54  brain]
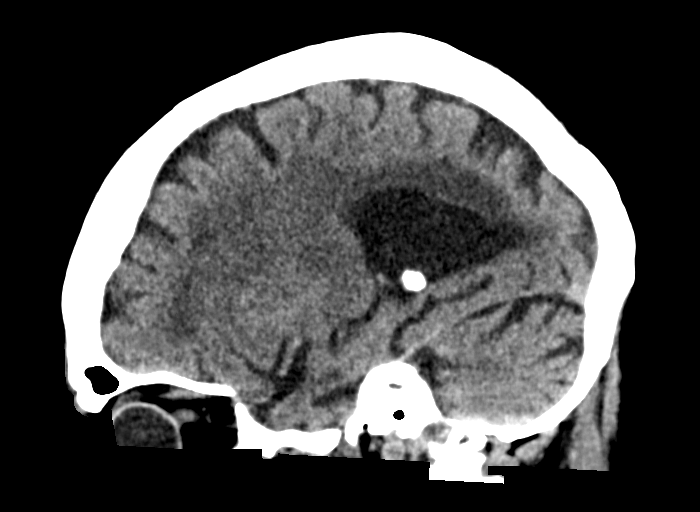
[im 27/54  brain]
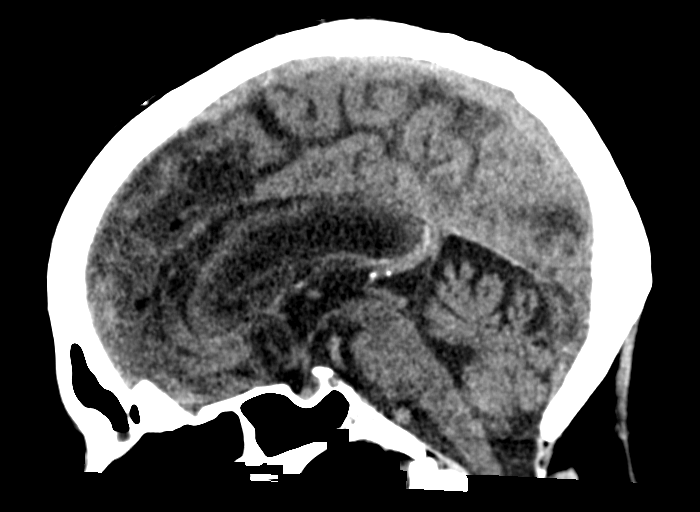
[im 36/54  brain]
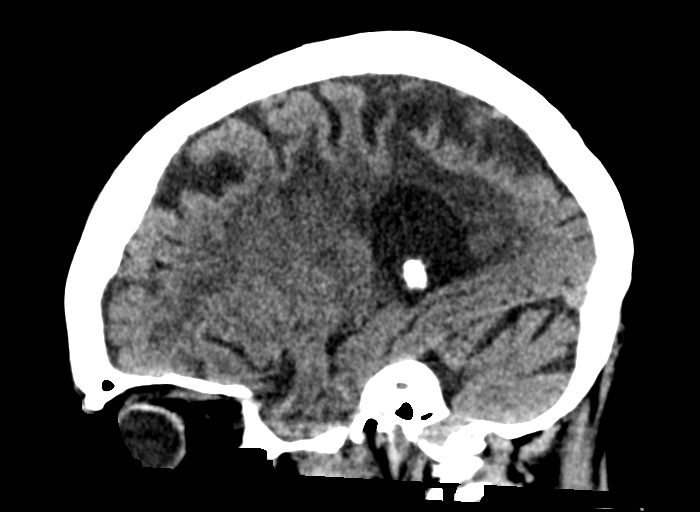

[15 of 47 positions shown; findings below may reference images not displayed]

FINDINGS: CT HEAD FINDINGS

Brain: A small to moderate volume of hypoattenuation, loss of the
gray-white matter differentiation, possible cortical petechial
hemorrhages, and volume loss in the posterior right frontal and the
anterior right parietal lobe likely represents a subacute or chronic
infarct. No evidence of acute hemorrhage, hydrocephalus, extra-axial
collection or mass lesion/mass effect. Periventricular white matter
hypoattenuation likely represents chronic small vessel ischemic
disease. There is mild cerebral volume loss with associated ex vacuo
dilatation.

Vascular: There are vascular calcifications in the carotid siphons.

Skull: Normal. Negative for fracture or focal lesion.

Sinuses/Orbits: Bilateral ethmoid sinus disease is noted.

Other: None.

CT CERVICAL SPINE FINDINGS

Alignment: Normal.

Skull base and vertebrae: No acute fracture. No primary bone lesion
or focal pathologic process.

Soft tissues and spinal canal: No prevertebral fluid or swelling. No
visible canal hematoma.

Disc levels: Up to severe multilevel degenerative disc and joint
disease.

Upper chest: Negative.

Other: None.
IMPRESSION: 1. Small to moderate volume subacute or chronic infarct in the right
frontal and parietal lobes.
2. No acute osseous injury in the cervical spine.

These results were called by telephone at the time of interpretation
on 05/23/2021 at [DATE] to provider SHIRI ADDO PA, who verbally
acknowledged these results.

## 2022-08-05 IMAGING — CT CT CERVICAL SPINE W/O CM
3 of 4 series · 11 of 33 positions shown, 13 images · non-contrast
Comparison: None.

CLINICAL DATA: Increased weakness for the past 2 days with multiple
falls.

EXAM:
CT HEAD WITHOUT CONTRAST
CT CERVICAL SPINE WITHOUT CONTRAST
TECHNIQUE: Multidetector CT imaging of the head and cervical spine was
performed following the standard protocol without intravenous
contrast. Multiplanar CT image reconstructions of the cervical spine
were also generated.

[Series 4: c spine soft · axial · 0.35mm/px · z∈[-392,-310]mm · 4 of 59 slices shown, 5 images]
[im 9/59  soft-tissue]
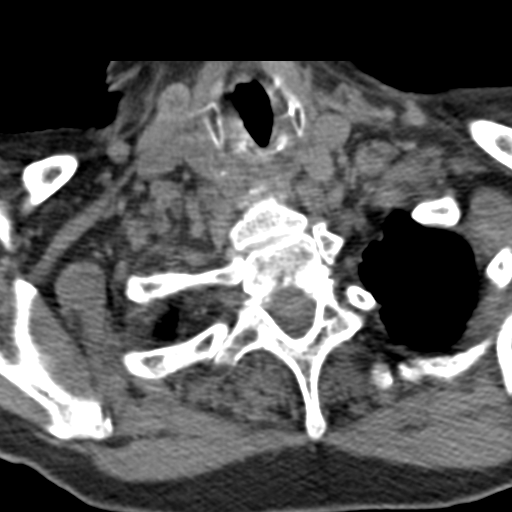
[im 9/59  bone]
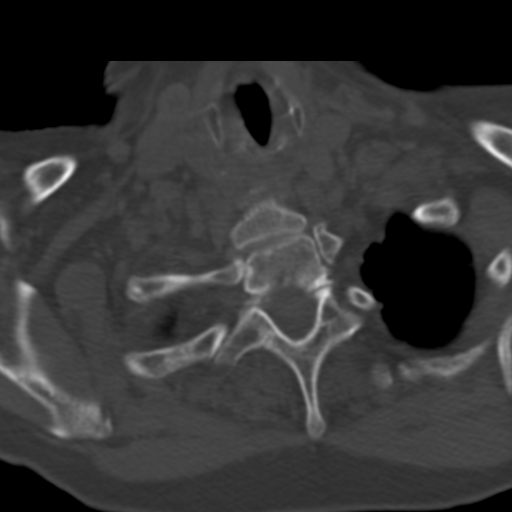
[im 23/59  bone]
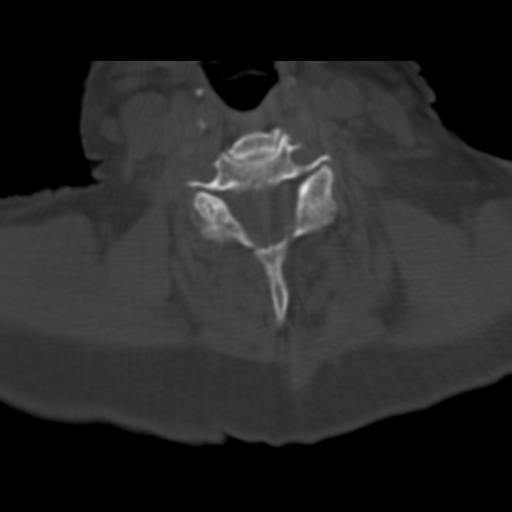
[im 36/59  bone]
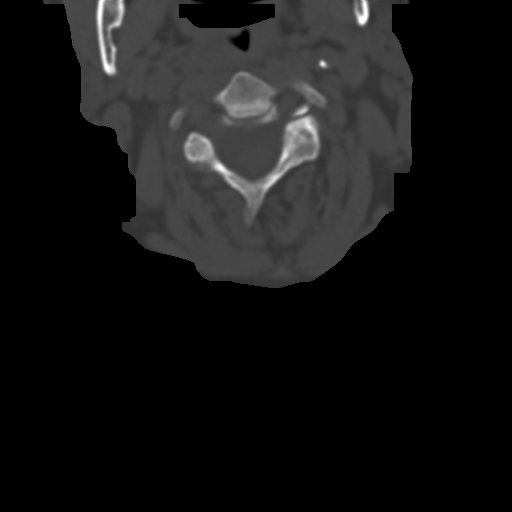
[im 50/59  bone]
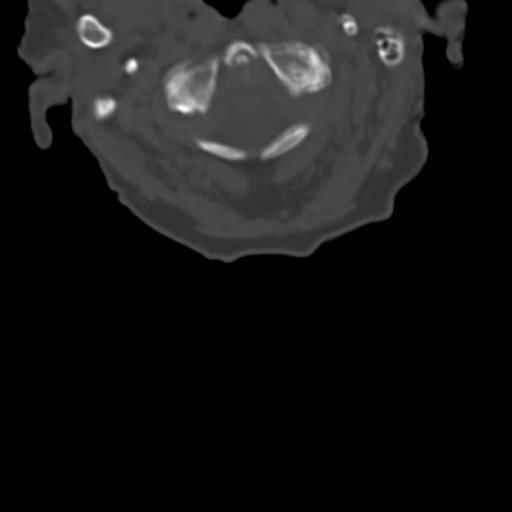

[Series 5: cor bone · coronal · 0.31mm/px · 2 of 42 slices shown]
[im 14/42  bone]
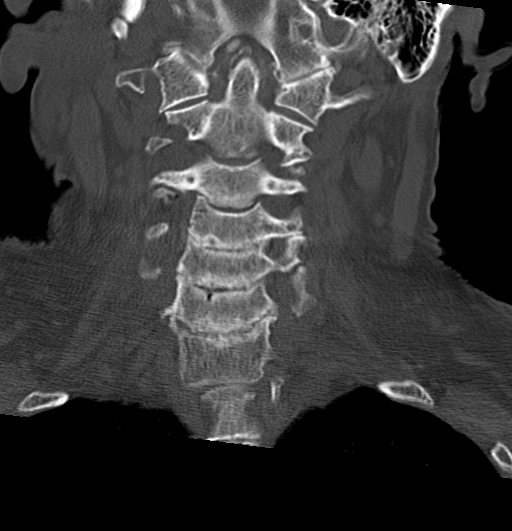
[im 28/42  bone]
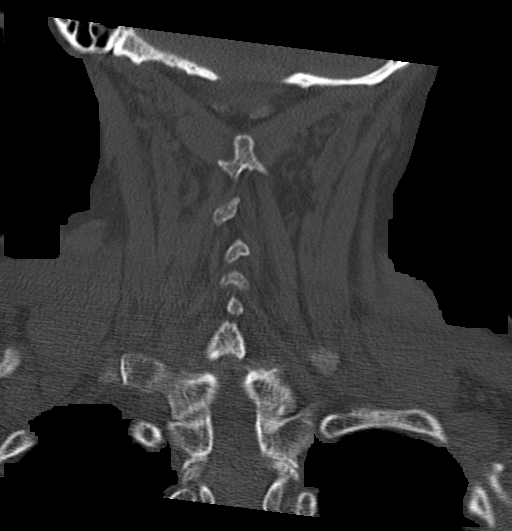

[Series 6: sag bone · sagittal · 0.24mm/px · 5 of 61 slices shown, 6 images]
[im 21/61  bone]
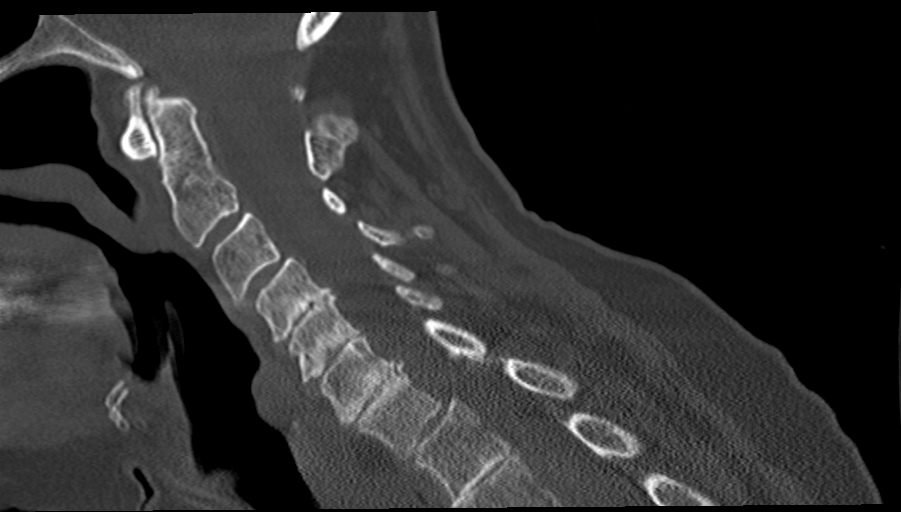
[im 26/61  bone]
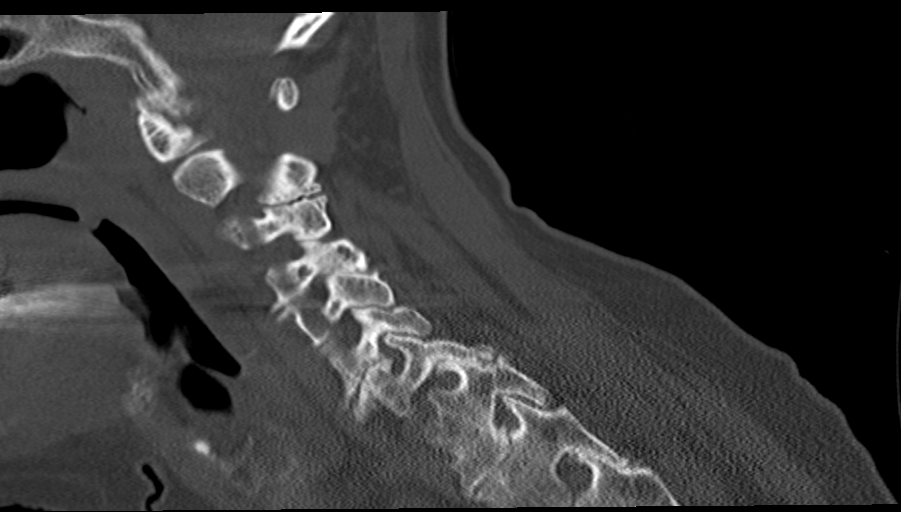
[im 31/61  soft-tissue]
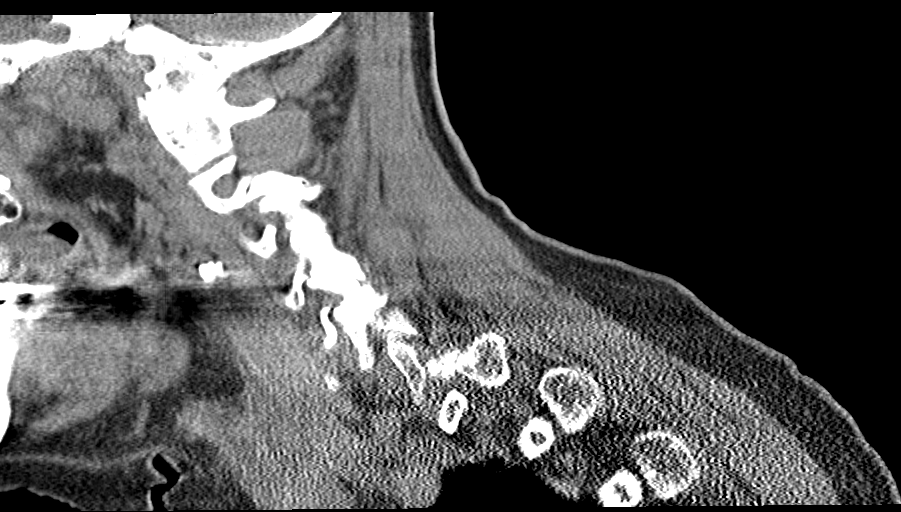
[im 31/61  bone]
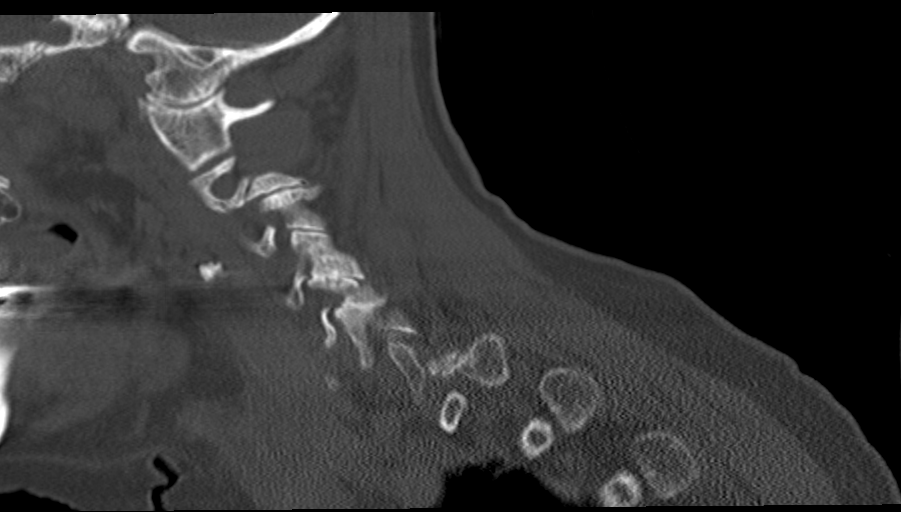
[im 36/61  bone]
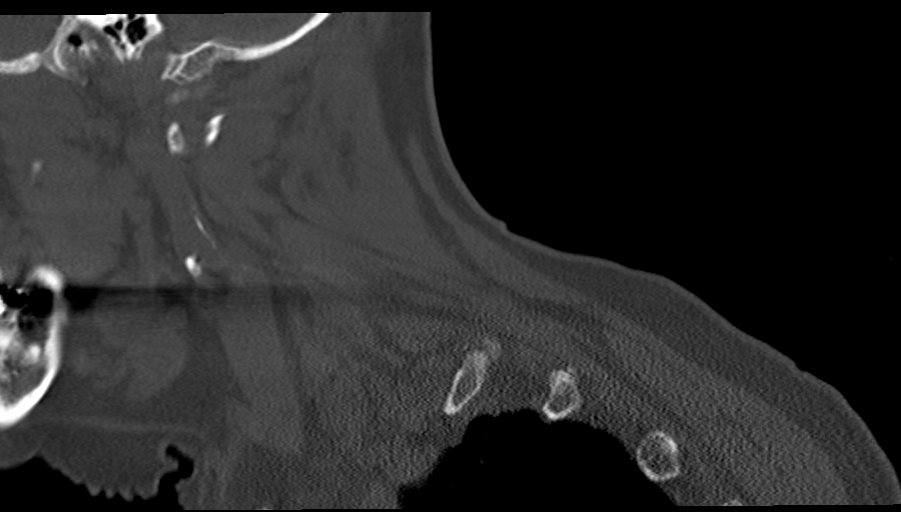
[im 41/61  bone]
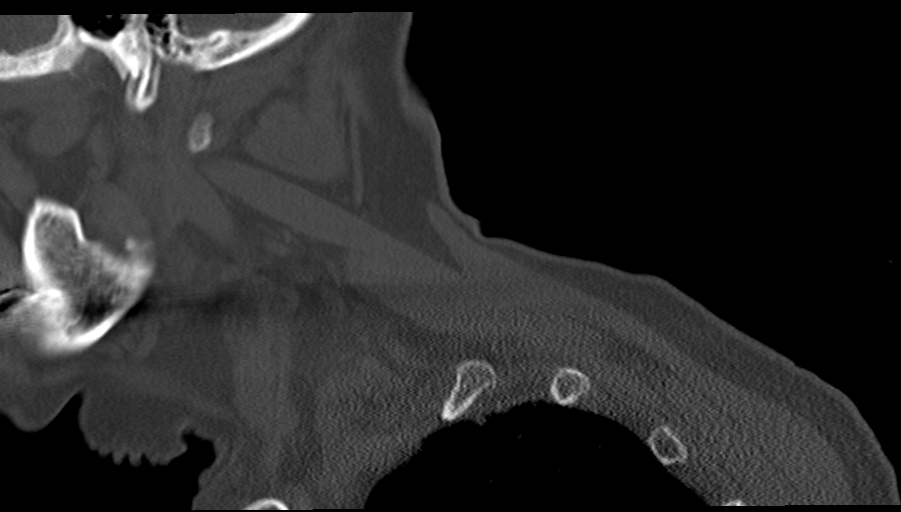

[11 of 33 positions shown; findings below may reference images not displayed]

FINDINGS: CT HEAD FINDINGS

Brain: A small to moderate volume of hypoattenuation, loss of the
gray-white matter differentiation, possible cortical petechial
hemorrhages, and volume loss in the posterior right frontal and the
anterior right parietal lobe likely represents a subacute or chronic
infarct. No evidence of acute hemorrhage, hydrocephalus, extra-axial
collection or mass lesion/mass effect. Periventricular white matter
hypoattenuation likely represents chronic small vessel ischemic
disease. There is mild cerebral volume loss with associated ex vacuo
dilatation.

Vascular: There are vascular calcifications in the carotid siphons.

Skull: Normal. Negative for fracture or focal lesion.

Sinuses/Orbits: Bilateral ethmoid sinus disease is noted.

Other: None.

CT CERVICAL SPINE FINDINGS

Alignment: Normal.

Skull base and vertebrae: No acute fracture. No primary bone lesion
or focal pathologic process.

Soft tissues and spinal canal: No prevertebral fluid or swelling. No
visible canal hematoma.

Disc levels: Up to severe multilevel degenerative disc and joint
disease.

Upper chest: Negative.

Other: None.
IMPRESSION: 1. Small to moderate volume subacute or chronic infarct in the right
frontal and parietal lobes.
2. No acute osseous injury in the cervical spine.

These results were called by telephone at the time of interpretation
on 05/23/2021 at [DATE] to provider SHIRI ADDO PA, who verbally
acknowledged these results.

## 2022-09-29 ENCOUNTER — Other Ambulatory Visit: Payer: Self-pay | Admitting: Cardiovascular Disease

## 2022-09-29 DIAGNOSIS — I4821 Permanent atrial fibrillation: Secondary | ICD-10-CM

## 2022-09-29 NOTE — Telephone Encounter (Signed)
Prescription refill request for Eliquis received. Indication: Afib  Last office visit: 05/18/21 ([facility])  Scr: 0.97 (02/13/22)  Age: 89 Weight: 78kg  Overdue for office visit. Message sent to schedulers.

## 2022-09-30 ENCOUNTER — Ambulatory Visit: Payer: Medicare Other | Admitting: Family Medicine

## 2022-09-30 NOTE — Telephone Encounter (Signed)
Appt canceled  by Judson Roch, RN. Pt contacted Dr Acie Fredrickson personally concerning follow-up appt and refill. Send future refills for Eliquis to Dr Elmarie Shiley nurse.

## 2022-09-30 NOTE — Telephone Encounter (Signed)
Pt has scheduled appt with Dr Acie Fredrickson on 10/05/22. Refill sent.

## 2022-10-05 ENCOUNTER — Ambulatory Visit: Payer: Medicare Other | Admitting: Cardiovascular Disease

## 2022-10-14 ENCOUNTER — Encounter: Payer: Self-pay | Admitting: Family Medicine

## 2022-10-14 ENCOUNTER — Ambulatory Visit (INDEPENDENT_AMBULATORY_CARE_PROVIDER_SITE_OTHER): Payer: Medicare Other | Admitting: Family Medicine

## 2022-10-14 VITALS — BP 125/77 | HR 77 | Ht 62.0 in | Wt 160.0 lb

## 2022-10-14 DIAGNOSIS — R638 Other symptoms and signs concerning food and fluid intake: Secondary | ICD-10-CM

## 2022-10-14 DIAGNOSIS — W19XXXA Unspecified fall, initial encounter: Secondary | ICD-10-CM

## 2022-10-14 DIAGNOSIS — I10 Essential (primary) hypertension: Secondary | ICD-10-CM

## 2022-10-14 DIAGNOSIS — Z7409 Other reduced mobility: Secondary | ICD-10-CM

## 2022-10-14 DIAGNOSIS — I4821 Permanent atrial fibrillation: Secondary | ICD-10-CM

## 2022-10-14 DIAGNOSIS — R269 Unspecified abnormalities of gait and mobility: Secondary | ICD-10-CM | POA: Diagnosis not present

## 2022-10-14 DIAGNOSIS — M810 Age-related osteoporosis without current pathological fracture: Secondary | ICD-10-CM

## 2022-10-14 DIAGNOSIS — F01A3 Vascular dementia, mild, with mood disturbance: Secondary | ICD-10-CM

## 2022-10-14 DIAGNOSIS — R32 Unspecified urinary incontinence: Secondary | ICD-10-CM | POA: Insufficient documentation

## 2022-10-14 HISTORY — DX: Other symptoms and signs concerning food and fluid intake: R63.8

## 2022-10-14 MED ORDER — COMFORT SHIELD ADULT DIAPERS MISC: 1.0000 | Freq: Every day | 11 refills | Status: AC

## 2022-10-14 MED ORDER — ENSURE HIGH PROTEIN PO LIQD: 1.0000 | Freq: Every day | ORAL | 99 refills | Status: AC

## 2022-10-14 NOTE — Patient Instructions (Addendum)
When you need refill of Apixaban (Eliquis) let Dr Mir Fullilove's office know.  He will be refilling your- Apixaban instead of Dr Acie Fredrickson.   Your Blood pressure is under good control.  You do not need to restart the Olmesartan bllod pressure medicine.   Keep taking your Duloxetine 20 mg tablet, two tablets once a day.   Remember to take your alendronate (Fosamax) once a week on an empty stomach.  Do not take any other medications or food within thirty minutes of the alendronate.  The medicines and food will bind to the alendronate, preventing absorption of the alendronate and other medicines.   A referral was made to the Physical Therapist to see if you would benefit from a Walker with 4 wheels.

## 2022-10-14 NOTE — Assessment & Plan Note (Addendum)
Established problem Acceptable blood pressure measurement off all antihypertensive medications, including olmesartan. Plan Do not restart olmesartan.  Monitor at visits

## 2022-10-14 NOTE — Assessment & Plan Note (Addendum)
Established problem Discussed importance of taking alendronate weekly on empty stomach and separtaed from food and other meds by at least thirty minutes.  Will continue for 5 years, ~ 03/2026.

## 2022-10-14 NOTE — Assessment & Plan Note (Signed)
Established problem Daughter reporting her mother having difficulty with dressing.  Patient says she has been showering without assistance.  No reported difficulty with toileting, eating.  Short term memory, and recall of names and nouns have declined since last visit per daughter.  No behavioral issues.  Will continue to monitor at visits.

## 2022-10-14 NOTE — Assessment & Plan Note (Signed)
Established problem Well Controlled and is at goal of rate control without med therapy, and on Apixaban.  Dr Johnsie Cancel thought Sharon Dodson is at a point where he can turn care over to PCP. We will start prescribing the Apixaban. . No signs of complications, medication side effects, or red flags. Continue current medications and other regiments.

## 2022-10-14 NOTE — Assessment & Plan Note (Signed)
Established problem Declining weight. Decreased by mouth intake Prescription for Ensure, one can daily, given to see if [employer] for Life will cover.

## 2022-10-14 NOTE — Progress Notes (Signed)
Sharon Dodson is accompanied by daughter and husband Sources of clinical information for visit is/are patient and relative(s). Nursing assessment for this office visit was reviewed with the patient for accuracy and revision.     Previous Report(s) Reviewed: none     04/22/2022    2:24 PM  Depression screen PHQ 2/9  Decreased Interest 0  Down, Depressed, Hopeless 0  PHQ - 2 Score 0   Flowsheet Row Office Visit from 03/18/2022 in [place] Office Visit from 07/09/2021 in [place] Office Visit from 06/04/2021 in [place]  Thoughts that you would be better off dead, or of hurting yourself in some way Not at all Not at all Not at all  PHQ-9 Total Score 0 1 16          10/14/2022   10:57 AM 04/22/2022    2:24 PM 10/01/2021    1:50 PM 07/09/2021   11:24 AM 06/04/2021    9:49 AM  Fall Risk   Falls in the past year? 1 1 1 1 1   Number falls in past yr: 0  0 1 0  Injury with Fall? 1  1 0 0       04/22/2022    2:24 PM 03/18/2022   10:17 AM 07/09/2021   11:24 AM  PHQ9 SCORE ONLY  PHQ-9 Total Score 0 0 1    There are no preventive care reminders to display for this patient.  Health Maintenance Due  Topic Date Due   Medicare Annual Wellness (AWV)  Never done   DTaP/Tdap/Td (2 - Td or Tdap) 03/12/2016   COVID-19 Vaccine (6 - 2023-24 season) 05/07/2022      History/P.E. limitations: memory impairment  There are no preventive care reminders to display for this patient. There are no preventive care reminders to display for this patient.  Health Maintenance Due  Topic Date Due   Medicare Annual Wellness (AWV)  Never done   DTaP/Tdap/Td (2 - Td or Tdap) 03/12/2016   COVID-19 Vaccine (6 - 2023-24 season) 05/07/2022     Chief Complaint  Patient presents with   Hypertension      --------------------------------------------------------------------------------------------------------------------------------------------- Visit Problem List with A/P  No problem-specific Assessment & Plan notes found for this encounter.

## 2022-10-14 NOTE — Assessment & Plan Note (Signed)
New fall Fell while reaching. Happened couple months ago.  Hit lef tshoulder is frozen and occasionally hurts briefly with particular motions. .    No further falls Referral to [facility] Physical Therapy for eval and treatment.

## 2022-10-14 NOTE — Assessment & Plan Note (Signed)
Established problem Patient requires up to 6 adult undergarments a day for urinary incontinence.  Prescription given for adult undergarments to see if [employer] for life will cover them.

## 2022-10-14 NOTE — Assessment & Plan Note (Signed)
Established problem Using walker with front wheels Daughter asking if 4 wheel walker would be better.  Referral placed to [facility] Physical Therapy to assess and treat

## 2022-10-16 ENCOUNTER — Telehealth: Payer: Self-pay | Admitting: Student

## 2022-10-16 NOTE — Telephone Encounter (Signed)
**  After Hours/ Emergency Line Call**  Received a page to call [phone]) - [phone].  Patient: Sharon Dodson  Caller:  Luster Landsberg, [place] Confirmed name & DOB of patient with caller  Saw her today and she had high BP reading 135/100, no symptoms. BP taken just after meds were taken this am.   Luster Landsberg w/ [facility].   Assessment & Plan  Patient just seen by home health who noted elevated BP. BP will likely trend down, as patient had just taken meds prior to BP being taken.   Recommendations:  None   -- Will forward to PCP.  Holley Bouche, MD [facility], PGY-1

## 2022-10-18 ENCOUNTER — Telehealth: Payer: Self-pay

## 2022-10-18 NOTE — Telephone Encounter (Signed)
[facility] PT calls nurse line requesting verbal orders as follows for [facility] PT.   2x a week for 2 weeks  1x a week for 6 weeks  Joe also requests a [facility] OT evaluation.   Verbal order given for all requested orders.

## 2022-10-18 NOTE — Telephone Encounter (Signed)
Reviewed. No action.

## 2022-12-06 ENCOUNTER — Other Ambulatory Visit: Payer: Self-pay | Admitting: Family Medicine

## 2022-12-06 DIAGNOSIS — Z8673 Personal history of transient ischemic attack (TIA), and cerebral infarction without residual deficits: Secondary | ICD-10-CM

## 2022-12-06 DIAGNOSIS — E7800 Pure hypercholesterolemia, unspecified: Secondary | ICD-10-CM

## 2022-12-23 ENCOUNTER — Other Ambulatory Visit: Payer: Self-pay | Admitting: Family Medicine

## 2022-12-23 DIAGNOSIS — M810 Age-related osteoporosis without current pathological fracture: Secondary | ICD-10-CM

## 2023-01-27 ENCOUNTER — Other Ambulatory Visit: Payer: Self-pay | Admitting: Family Medicine

## 2023-01-27 DIAGNOSIS — J309 Allergic rhinitis, unspecified: Secondary | ICD-10-CM

## 2023-02-28 ENCOUNTER — Other Ambulatory Visit: Payer: Self-pay | Admitting: Cardiovascular Disease

## 2023-02-28 DIAGNOSIS — I4821 Permanent atrial fibrillation: Secondary | ICD-10-CM

## 2023-02-28 NOTE — Telephone Encounter (Signed)
Prescription refill request for Eliquis received. Indication:afib Last office visit:needs appt Scr:0.97  6/23 Age: 89 Weight:72.6  kg  Prescription refilled

## 2023-03-10 NOTE — Patient Instructions (Addendum)
[name] , Thank you for taking time to come for your Medicare Wellness Visit. I appreciate your ongoing commitment to your health goals. Please review the following plan we discussed and let me know if I can assist you in the future.   These are the goals we discussed:  Goals      Prevent falls        This is a list of the screening recommended for you and due dates:  Health Maintenance  Topic Date Due   DTaP/Tdap/Td vaccine (2 - Td or Tdap) 03/12/2016   COVID-19 Vaccine (6 - 2023-24 season) 05/07/2022   Flu Shot  04/07/2023   Medicare Annual Wellness Visit  03/10/2024   Pneumonia Vaccine  Completed   DEXA scan (bone density measurement)  Completed   Zoster (Shingles) Vaccine  Completed   HPV Vaccine  Aged Out    Advanced directives: We have a copy of your advanced directives available in your record should your provider ever need to access them.  Conditions/risks identified: Aim for 30 minutes of exercise or brisk walking, 6-8 glasses of water, and 5 servings of fruits and vegetables each day.  Next appointment: Follow up in one year for your annual wellness visit   Additional caregiver support can be found at [email or web address]  Preventive Care 65 Years and Older, Female Preventive care refers to lifestyle choices and visits with your health care provider that can promote health and wellness. What does preventive care include? A yearly physical exam. This is also called an annual well check. Dental exams once or twice a year. Routine eye exams. Ask your health care provider how often you should have your eyes checked. Personal lifestyle choices, including: Daily care of your teeth and gums. Regular physical activity. Eating a healthy diet. Avoiding tobacco and drug use. Limiting alcohol use. Practicing safe sex. Taking low-dose aspirin every day. Taking vitamin and mineral supplements as recommended by your health care provider. What happens during an  annual well check? The services and screenings done by your health care provider during your annual well check will depend on your age, overall health, lifestyle risk factors, and family history of disease. Counseling  Your health care provider may ask you questions about your: Alcohol use. Tobacco use. Drug use. Emotional well-being. Home and relationship well-being. Sexual activity. Eating habits. History of falls. Memory and ability to understand (cognition). Work and work Astronomer. Reproductive health. Screening  You may have the following tests or measurements: Height, weight, and BMI. Blood pressure. Lipid and cholesterol levels. These may be checked every 5 years, or more frequently if you are over 19 years old. Skin check. Lung cancer screening. You may have this screening every year starting at age 12 if you have a 30-pack-year history of smoking and currently smoke or have quit within the past 15 years. Fecal occult blood test (FOBT) of the stool. You may have this test every year starting at age 39. Flexible sigmoidoscopy or colonoscopy. You may have a sigmoidoscopy every 5 years or a colonoscopy every 10 years starting at age 47. Hepatitis C blood test. Hepatitis B blood test. Sexually transmitted disease (STD) testing. Diabetes screening. This is done by checking your blood sugar (glucose) after you have not eaten for a while (fasting). You may have this done every 1-3 years. Bone density scan. This is done to screen for osteoporosis. You may have this done starting at age 64. Mammogram. This may be done every 1-2 years. Talk to your  health care provider about how often you should have regular mammograms. Talk with your health care provider about your test results, treatment options, and if necessary, the need for more tests. Vaccines  Your health care provider may recommend certain vaccines, such as: Influenza vaccine. This is recommended every year. Tetanus,  diphtheria, and acellular pertussis (Tdap, Td) vaccine. You may need a Td booster every 10 years. Zoster vaccine. You may need this after age 37. Pneumococcal 13-valent conjugate (PCV13) vaccine. One dose is recommended after age 3. Pneumococcal polysaccharide (PPSV23) vaccine. One dose is recommended after age 6. Talk to your health care provider about which screenings and vaccines you need and how often you need them. This information is not intended to replace advice given to you by your health care provider. Make sure you discuss any questions you have with your health care provider. Document Released: 09/19/2015 Document Revised: 05/12/2016 Document Reviewed: 06/24/2015 Elsevier Interactive Patient Education  2017 [employer].  Fall Prevention in the Home Falls can cause injuries. They can happen to people of all ages. There are many things you can do to make your home safe and to help prevent falls. What can I do on the outside of my home? Regularly fix the edges of walkways and driveways and fix any cracks. Remove anything that might make you trip as you walk through a door, such as a raised step or threshold. Trim any bushes or trees on the path to your home. Use bright outdoor lighting. Clear any walking paths of anything that might make someone trip, such as rocks or tools. Regularly check to see if handrails are loose or broken. Make sure that both sides of any steps have handrails. Any raised decks and porches should have guardrails on the edges. Have any leaves, snow, or ice cleared regularly. Use sand or salt on walking paths during winter. Clean up any spills in your garage right away. This includes oil or grease spills. What can I do in the bathroom? Use night lights. Install grab bars by the toilet and in the tub and shower. Do not use towel bars as grab bars. Use non-skid mats or decals in the tub or shower. If you need to sit down in the shower, use a plastic,  non-slip stool. Keep the floor dry. Clean up any water that spills on the floor as soon as it happens. Remove soap buildup in the tub or shower regularly. Attach bath mats securely with double-sided non-slip rug tape. Do not have throw rugs and other things on the floor that can make you trip. What can I do in the bedroom? Use night lights. Make sure that you have a light by your bed that is easy to reach. Do not use any sheets or blankets that are too big for your bed. They should not hang down onto the floor. Have a firm chair that has side arms. You can use this for support while you get dressed. Do not have throw rugs and other things on the floor that can make you trip. What can I do in the kitchen? Clean up any spills right away. Avoid walking on wet floors. Keep items that you use a lot in easy-to-reach places. If you need to reach something above you, use a strong step stool that has a grab bar. Keep electrical cords out of the way. Do not use floor polish or wax that makes floors slippery. If you must use wax, use non-skid floor wax. Do not have  throw rugs and other things on the floor that can make you trip. What can I do with my stairs? Do not leave any items on the stairs. Make sure that there are handrails on both sides of the stairs and use them. Fix handrails that are broken or loose. Make sure that handrails are as long as the stairways. Check any carpeting to make sure that it is firmly attached to the stairs. Fix any carpet that is loose or worn. Avoid having throw rugs at the top or bottom of the stairs. If you do have throw rugs, attach them to the floor with carpet tape. Make sure that you have a light switch at the top of the stairs and the bottom of the stairs. If you do not have them, ask someone to add them for you. What else can I do to help prevent falls? Wear shoes that: Do not have high heels. Have rubber bottoms. Are comfortable and fit you well. Are closed  at the toe. Do not wear sandals. If you use a stepladder: Make sure that it is fully opened. Do not climb a closed stepladder. Make sure that both sides of the stepladder are locked into place. Ask someone to hold it for you, if possible. Clearly mark and make sure that you can see: Any grab bars or handrails. First and last steps. Where the edge of each step is. Use tools that help you move around (mobility aids) if they are needed. These include: Canes. Walkers. Scooters. Crutches. Turn on the lights when you go into a dark area. Replace any light bulbs as soon as they burn out. Set up your furniture so you have a clear path. Avoid moving your furniture around. If any of your floors are uneven, fix them. If there are any pets around you, be aware of where they are. Review your medicines with your doctor. Some medicines can make you feel dizzy. This can increase your chance of falling. Ask your doctor what other things that you can do to help prevent falls. This information is not intended to replace advice given to you by your health care provider. Make sure you discuss any questions you have with your health care provider. Document Released: 06/19/2009 Document Revised: 01/29/2016 Document Reviewed: 09/27/2014 Elsevier Interactive Patient Education  2017 [employer].

## 2023-03-10 NOTE — Progress Notes (Signed)
Subjective:   Sharon Dodson is a 89 y.o. female who presents for an Initial Medicare Annual Wellness Visit.  Visit Complete: Virtual  I connected with  Sharon Dodson on 03/11/23 by a audio enabled telemedicine application and verified that I am speaking with the correct person using two identifiers.  Patient Location: Home  Provider Location: Home Office  I discussed the limitations of evaluation and management by telemedicine. The patient expressed understanding and agreed to proceed.  Review of Systems     Cardiac Risk Factors include: advanced age (>63men, >56 women);hypertension;sedentary lifestyle     Objective:    Today's Vitals   03/11/23 1623  Weight: 160 lb (72.6 kg)  Height: 5\' 2"  (1.575 m)   Body mass index is 29.26 kg/m.     03/11/2023    4:27 PM 04/22/2022    2:22 PM 02/13/2022    3:33 PM 07/09/2021   11:24 AM 06/04/2021    9:50 AM 05/23/2021   12:13 PM 04/23/2021   10:29 AM  Advanced Directives  Does Patient Have a Medical Advance Directive? Yes Yes No Yes Yes Yes Yes  Type of Estate agent of [place];Living will Healthcare Power of [place];Living will  Healthcare Power of Attorney     Does patient want to make changes to medical advance directive? No - Patient declined        Copy of Healthcare Power of Attorney in Chart? Yes - validated most recent copy scanned in chart (See row information) Yes - validated most recent copy scanned in chart (See row information)  Yes - validated most recent copy scanned in chart (See row information)     Would patient like information on creating a medical advance directive?   No - Patient declined        Current Medications (verified) Outpatient Encounter Medications as of 03/11/2023  Medication Sig   alendronate (FOSAMAX) 70 MG tablet TAKE 1 TABLET EVERY 7 DAYS WITH A FULL GLASS OF WATER ON AN EMPTY STOMACH   apixaban (ELIQUIS) 5 MG TABS tablet Take 1 tablet (5 mg total) by mouth 2 (two) times daily.  Needs Cardiology appt, please call office   calcium-vitamin D (OSCAL 500/200 D-3) 500-200 MG-UNIT tablet Take 2 tablets by mouth daily with breakfast.   Cyanocobalamin (VITAMIN B-12 PO) Take 1 tablet by mouth daily.    DULoxetine (CYMBALTA) 20 MG capsule TAKE 1 CAPSULE DAILY (Patient taking differently: 40 mg daily.)   Incontinence Supply Disposable (COMFORT SHIELD ADULT DIAPERS) MISC 1 each by Does not apply route 6 (six) times daily.   loratadine (CLARITIN) 10 MG tablet TAKE 1 TABLET DAILY (STOP CLARINEX PRESCRIPTION)   Multiple Vitamins-Minerals (PRESERVISION AREDS 2) CAPS Take 1 capsule by mouth daily.   Nutritional Supplements (ENSURE HIGH PROTEIN) LIQD Take 1 Can by mouth daily.   pravastatin (PRAVACHOL) 80 MG tablet TAKE 1 TABLET DAILY   No facility-administered encounter medications on file as of 03/11/2023.    Allergies (verified) Sulfa antibiotics, Sulfasalazine, and Hydrocodone   History: Past Medical History:  Diagnosis Date   Accidental fall 05/23/2021   Allergic rhinitis 07/02/2014   Anemia 07/02/2014   Ankle edema, bilateral 05/02/2020   Likely venous insufficiency   Atrial fibrillation, permanent (HCC) 11/14/2020   Benign essential HTN 05/01/2020   Cerebral infarction (HCC) 07/27/2010   COVID 05/23/2021   Decreased range of motion of left shoulder 05/02/2020   Dizziness 03/19/2022   Environmental and seasonal allergies 01/18/2020   Falls 04/13/2021   Gait  abnormality 01/09/2021   GERD (gastroesophageal reflux disease) 07/27/2010   H/O reduction of closed dislocation 12/06/2012   Hallux abductovalgus with bunions 01/09/2021   History of cerebral infarction 05/01/2020   Formatting of this note might be different from the original. 07/2010, RIGHT parietal lobe, without residual effects   Hypercholesterolemia 07/02/2014   Insomnia 07/02/2014   LVH (left ventricular hypertrophy) 01/08/2021   Found on transthoracic echocardiogram 11/2020   Mood disorder (HCC) 04/27/2021   Nocturia  05/01/2020   Osteoporosis 07/02/2014   Pure hypercholesterolemia 05/01/2020   RCT (rotator cuff tear) 12/21/2012   Shoulder dislocation 10/16/2012   Uncomplicated asthma 05/01/2020   Unsteady gait 07/02/2014   Urgency incontinence 07/02/2014   Venous insufficiency 07/02/2014   Venous stasis dermatitis of right lower extremity 06/03/2020   Ventral hernia, sliding 10/03/2020   History reviewed. No pertinent surgical history. Family History  Adopted: Yes  Family history unknown: Yes   Social History   Socioeconomic History   Marital status: Married    Spouse name: Not on file   Number of children: Not on file   Years of education: >16    Highest education level: Master's degree (e.g., MA, MS, MEng, MEd, MSW, MBA)  Occupational History   Occupation: Doctor, general practice    Comment: retired  Tobacco Use   Smoking status: Never   Smokeless tobacco: Never  Substance and Sexual Activity   Alcohol use: Not on file   Drug use: Not on file   Sexual activity: Not on file  Other Topics Concern   Not on file  Social History Narrative   Not on file   Social Determinants of Health   Financial Resource Strain: Low Risk  (03/11/2023)   Overall Financial Resource Strain (CARDIA)    Difficulty of Paying Living Expenses: Not hard at all  Food Insecurity: No Food Insecurity (03/11/2023)   Hunger Vital Sign    Worried About Running Out of Food in the Last Year: Never true    Ran Out of Food in the Last Year: Never true  Transportation Needs: No Transportation Needs (03/11/2023)   PRAPARE - Administrator, Civil Service (Medical): No    Lack of Transportation (Non-Medical): No  Physical Activity: Insufficiently Active (03/11/2023)   Exercise Vital Sign    Days of Exercise per Week: 3 days    Minutes of Exercise per Session: 30 min  Stress: No Stress Concern Present (03/11/2023)   [employer] of Occupational Health - Occupational Stress Questionnaire    Feeling of Stress : Not at all   Social Connections: Moderately Integrated (03/11/2023)   Social Connection and Isolation Panel [NHANES]    Frequency of Communication with Friends and Family: More than three times a week    Frequency of Social Gatherings with Friends and Family: Three times a week    Attends Religious Services: 1 to 4 times per year    Active Member of Clubs or Organizations: No    Attends Banker Meetings: Never    Marital Status: Married    Tobacco Counseling Counseling given: Not Answered   Clinical Intake:  Pre-visit preparation completed: Yes  Pain : No/denies pain     Diabetes: No  How often do you need to have someone help you when you read instructions, pamphlets, or other written materials from your doctor or pharmacy?: 1 - Never  Interpreter Needed?: No  Comments: Assisted in visit by daughter [name] Information entered by :: Kandis Fantasia LPN  Activities of Daily Living    03/11/2023    4:26 PM  In your present state of health, do you have any difficulty performing the following activities:  Hearing? 0  Vision? 0  Difficulty concentrating or making decisions? 1  Walking or climbing stairs? 1  Dressing or bathing? 1  Doing errands, shopping? 1  Preparing Food and eating ? N  Using the Toilet? Y  In the past six months, have you accidently leaked urine? Y  Do you have problems with loss of bowel control? N  Managing your Medications? Y  Managing your Finances? Y  Housekeeping or managing your Housekeeping? Y    Patient Care Team: McDiarmid, Leighton Roach, MD as PCP - General (Family Medicine)  Indicate any recent Medical Services you may have received from other than [facility] providers in the past year (date may be approximate).     Assessment:   This is a routine wellness examination for Mammie.  Hearing/Vision screen Hearing Screening - Comments:: Denies hearing difficulties   Vision Screening - Comments:: No vision problems; will schedule routine eye exam  soon    Dietary issues and exercise activities discussed:     Goals Addressed             This Visit's Progress    Prevent falls         Depression Screen    03/11/2023    4:25 PM 04/22/2022    2:24 PM 03/18/2022   10:17 AM 07/09/2021   11:24 AM 06/04/2021    9:49 AM 05/05/2021   10:43 AM 04/23/2021   10:30 AM  PHQ 2/9 Scores  PHQ - 2 Score 0 0 0 0 2 0 0  PHQ- 9 Score   0 1 16 0 0    Fall Risk    03/11/2023    4:26 PM 10/14/2022   10:57 AM 04/22/2022    2:24 PM 10/01/2021    1:50 PM 07/09/2021   11:24 AM  Fall Risk   Falls in the past year? 1 1 1 1 1   Number falls in past yr: 1 0  0 1  Injury with Fall? 1 1  1  0  Risk for fall due to : History of fall(s);Impaired balance/gait;Impaired mobility      Follow up Education provided;Falls prevention discussed;Falls evaluation completed        MEDICARE RISK AT HOME:  Medicare Risk at Home - 03/11/23 1626     Any stairs in or around the home? No    If so, are there any without handrails? No    Home free of loose throw rugs in walkways, pet beds, electrical cords, etc? Yes    Adequate lighting in your home to reduce risk of falls? Yes    Life alert? No    Use of a cane, walker or w/c? Yes    Grab bars in the bathroom? Yes    Shower chair or bench in shower? Yes    Elevated toilet seat or a handicapped toilet? Yes             TIMED UP AND GO:  Was the test performed? No    Cognitive Function: unable to perform will need assessment in office; current cognitive diagnosis         Immunizations Immunization History  Administered Date(s) Administered   Fluad Quad(high Dose 65+) 06/08/2022   Influenza Split 06/03/2018, 05/17/2019, 05/19/2021   Influenza, High Dose Seasonal PF 05/21/2015, 05/15/2016, 05/11/2017   Influenza, Seasonal,  Injecte, Preservative Fre 06/25/2003, 06/20/2010   Influenza,inj,Quad PF,6+ Mos 05/29/2020   PFIZER Comirnaty(Gray Top)Covid-19 Tri-Sucrose Vaccine 01/08/2021   PFIZER(Purple  Top)SARS-COV-2 Vaccination 10/11/2019, 11/02/2019, 05/29/2020   Pfizer Covid-19 Vaccine Bivalent Booster 56yrs & up 05/19/2021   Pneumococcal Conjugate-13 07/31/2014   Pneumococcal Polysaccharide-23 06/20/2010   Tdap 03/12/2006   Zoster Recombinant(Shingrix) 06/09/2018, 10/05/2018    TDAP status: Due, Education has been provided regarding the importance of this vaccine. Advised may receive this vaccine at local pharmacy or Health Dept. Aware to provide a copy of the vaccination record if obtained from local pharmacy or Health Dept. Verbalized acceptance and understanding.  Pneumococcal vaccine status: Up to date  Covid-19 vaccine status: Information provided on how to obtain vaccines.   Qualifies for Shingles Vaccine? Yes   Zostavax completed No   Shingrix Completed?: Yes  Screening Tests Health Maintenance  Topic Date Due   DTaP/Tdap/Td (2 - Td or Tdap) 03/12/2016   COVID-19 Vaccine (6 - 2023-24 season) 03/27/2023 (Originally 05/07/2022)   INFLUENZA VACCINE  04/07/2023   Medicare Annual Wellness (AWV)  03/10/2024   Pneumonia Vaccine 51+ Years old  Completed   DEXA SCAN  Completed   Zoster Vaccines- Shingrix  Completed   HPV VACCINES  Aged Out    Health Maintenance  Health Maintenance Due  Topic Date Due   DTaP/Tdap/Td (2 - Td or Tdap) 03/12/2016    Colorectal cancer screening: No longer required.   Mammogram status: No longer required due to age and preference.  Bone Density status: Completed 03/13/21. Results reflect: Bone density results: OSTEOPENIA. Repeat every 2 years.  Lung Cancer Screening: (Low Dose CT Chest recommended if Age 38-80 years, 20 pack-year currently smoking OR have quit w/in 15years.) does not qualify.   Lung Cancer Screening Referral: n/a  Additional Screening:  Hepatitis C Screening: does not qualify;   Vision Screening: Recommended annual ophthalmology exams for early detection of glaucoma and other disorders of the eye. Is the patient up to  date with their annual eye exam?  No  Who is the provider or what is the name of the office in which the patient attends annual eye exams? none If pt is not established with a provider, would they like to be referred to a provider to establish care? No .   Dental Screening: Recommended annual dental exams for proper oral hygiene  Community Resource Referral / Chronic Care Management: CRR required this visit?  No   CCM required this visit?  No     Plan:     I have personally reviewed and noted the following in the patient's chart:   Medical and social history Use of alcohol, tobacco or illicit drugs  Current medications and supplements including opioid prescriptions. Patient is not currently taking opioid prescriptions. Functional ability and status Nutritional status Physical activity Advanced directives List of other physicians Hospitalizations, surgeries, and ER visits in previous 12 months Vitals Screenings to include cognitive, depression, and falls Referrals and appointments  In addition, I have reviewed and discussed with patient certain preventive protocols, quality metrics, and best practice recommendations. A written personalized care plan for preventive services as well as general preventive health recommendations were provided to patient.     Kandis Fantasia [place], [place]   09/14/1476   After Visit Summary: (MyChart) Due to this being a telephonic visit, the after visit summary with patients personalized plan was offered to patient via MyChart   Nurse Notes: Daughter is wondering if prn medication can be prescribed for patient  to have during high anxiety situations; she has noticed more agitation from patient, and she needs more redirection than before.  Refill of all medications also needed at upcoming office visit

## 2023-03-11 ENCOUNTER — Ambulatory Visit (INDEPENDENT_AMBULATORY_CARE_PROVIDER_SITE_OTHER): Payer: Medicare Other

## 2023-03-11 VITALS — Ht 62.0 in | Wt 160.0 lb

## 2023-03-11 DIAGNOSIS — Z0000 Encounter for general adult medical examination without abnormal findings: Secondary | ICD-10-CM | POA: Diagnosis not present

## 2023-03-24 ENCOUNTER — Ambulatory Visit: Payer: Medicare Other | Admitting: Family Medicine

## 2023-03-31 ENCOUNTER — Ambulatory Visit (INDEPENDENT_AMBULATORY_CARE_PROVIDER_SITE_OTHER): Payer: Medicare Other | Admitting: Family Medicine

## 2023-03-31 ENCOUNTER — Encounter: Payer: Self-pay | Admitting: Family Medicine

## 2023-03-31 VITALS — BP 127/86 | HR 90 | Wt 162.4 lb

## 2023-03-31 DIAGNOSIS — K920 Hematemesis: Secondary | ICD-10-CM | POA: Diagnosis not present

## 2023-03-31 DIAGNOSIS — Z79899 Other long term (current) drug therapy: Secondary | ICD-10-CM

## 2023-03-31 DIAGNOSIS — I4821 Permanent atrial fibrillation: Secondary | ICD-10-CM | POA: Diagnosis not present

## 2023-03-31 DIAGNOSIS — F01B18 Vascular dementia, moderate, with other behavioral disturbance: Secondary | ICD-10-CM | POA: Diagnosis present

## 2023-03-31 DIAGNOSIS — K219 Gastro-esophageal reflux disease without esophagitis: Secondary | ICD-10-CM | POA: Diagnosis not present

## 2023-03-31 DIAGNOSIS — M810 Age-related osteoporosis without current pathological fracture: Secondary | ICD-10-CM

## 2023-03-31 MED ORDER — LORAZEPAM 0.5 MG PO TABS: 0.2500 mg | ORAL_TABLET | Freq: Two times a day (BID) | ORAL | 0 refills | Status: DC | PRN

## 2023-03-31 MED ORDER — OMEPRAZOLE 40 MG PO CPDR: 40.0000 mg | DELAYED_RELEASE_CAPSULE | Freq: Every day | ORAL | 0 refills | Status: DC

## 2023-03-31 NOTE — Patient Instructions (Addendum)
Please stop taking the bone building medication, Alendronate (Fosamax).  It could irritate your esophagus.  Please start taking omeprazole 40 mg capsule, one capsule each morning for 8 weeks, then stop.  This is to heal any injury to your esophagus that could be causing your spitting up brown material.    Take half a tablet of Lorazepam when going out for social activities  We are checking you for anemia and checking your kidneys, electrolytes, and blood sugar.   Dr Kensli Bowley will let you know it they are abnormal.  If they are normal, he will send your a message through your MyChart

## 2023-03-31 NOTE — Assessment & Plan Note (Signed)
Established problem Given concern for nocturnal spitting up of brown substance with history of brown emesis in summer ED visit 2023 that showed (+) gastroccult, will stop alendronate to reduce risk of esophageal irritation.

## 2023-03-31 NOTE — Assessment & Plan Note (Signed)
Established problem See Afib problem for report about noctural emesis os scant brown sputum Will treat with 8 week of prilosec 40 mg daily in case there is some mucosal inflammation causing the (+) gastroccult of brown emesis seen in ED June last year.

## 2023-03-31 NOTE — Progress Notes (Signed)
Sharon Dodson is accompanied by daughter, [name],  and patient's husband Sources of clinical information for visit is/are patient, spouse/SO, and relative(s). Nursing assessment for this office visit was reviewed with the patient for accuracy and revision.     Previous Report(s) Reviewed: none     03/11/2023    4:25 PM  Depression screen PHQ 2/9  Decreased Interest 0  Down, Depressed, Hopeless 0  PHQ - 2 Score 0   Flowsheet Row Office Visit from 03/18/2022 in [facility] Office Visit from 07/09/2021 in [facility] Office Visit from 06/04/2021 in [facility]  Thoughts that you would be better off dead, or of hurting yourself in some way Not at all Not at all Not at all  PHQ-9 Total Score 0 1 16          03/11/2023    4:26 PM 10/14/2022   10:57 AM 04/22/2022    2:24 PM 10/01/2021    1:50 PM 07/09/2021   11:24 AM  Fall Risk   Falls in the past year? 1 1 1 1 1   Number falls in past yr: 1 0  0 1  Injury with Fall? 1 1  1  0  Risk for fall due to : History of fall(s);Impaired balance/gait;Impaired mobility      Follow up Education provided;Falls prevention discussed;Falls evaluation completed           03/11/2023    4:25 PM 04/22/2022    2:24 PM 03/18/2022   10:17 AM  PHQ9 SCORE ONLY  PHQ-9 Total Score 0 0 0    There are no preventive care reminders to display for this patient.  Health Maintenance Due  Topic Date Due   DTaP/Tdap/Td (2 - Td or Tdap) 03/12/2016   COVID-19 Vaccine (6 - 2023-24 season) 05/07/2022      History/P.E. limitations: none  There are no preventive care reminders to display for this patient. There are no preventive care reminders to display for this patient.  Health Maintenance Due  Topic Date Due   DTaP/Tdap/Td (2 - Td or Tdap) 03/12/2016   COVID-19 Vaccine (6 - 2023-24 season) 05/07/2022     Chief Complaint  Patient presents with   Medication Refill      --------------------------------------------------------------------------------------------------------------------------------------------- Visit Problem List with A/P  No problem-specific Assessment & Plan notes found for this encounter.

## 2023-03-31 NOTE — Assessment & Plan Note (Signed)
Established problem Well Controlled. Patient is at goal of rate control without negative chronotropic agent. Taking Apixaban as directed.  Other than nocturnal spitting up of brown liquid staining the pillow 2 to 3 times a wekk, no bleeding. Checking CBC to look for anemia Continue Apixaban

## 2023-03-31 NOTE — Assessment & Plan Note (Signed)
FAST Stage: Stage 6 with need assistance selecting clothes, dressing Medication or substances that may impair patient's cognition: none Behavioral and Psychological Symptoms of Dementia: Anxiety when out in public expressed by intrusive behavior and uncontrolled talking.  Interfering with family's ability to interact with paitnet socially.   Plan: Trial of lorazepam 0.25 mg daily prn family activity outside of [facility].

## 2023-04-20 ENCOUNTER — Other Ambulatory Visit: Payer: Self-pay | Admitting: Family Medicine

## 2023-04-20 DIAGNOSIS — F01B18 Vascular dementia, moderate, with other behavioral disturbance: Secondary | ICD-10-CM

## 2023-06-17 ENCOUNTER — Other Ambulatory Visit: Payer: Self-pay | Admitting: Family Medicine

## 2023-06-17 DIAGNOSIS — I4821 Permanent atrial fibrillation: Secondary | ICD-10-CM

## 2023-06-17 DIAGNOSIS — K920 Hematemesis: Secondary | ICD-10-CM

## 2023-07-08 ENCOUNTER — Other Ambulatory Visit: Payer: Self-pay | Admitting: Family Medicine

## 2023-07-08 DIAGNOSIS — F39 Unspecified mood [affective] disorder: Secondary | ICD-10-CM

## 2023-07-11 ENCOUNTER — Encounter: Payer: Self-pay | Admitting: Family Medicine

## 2023-07-11 DIAGNOSIS — R269 Unspecified abnormalities of gait and mobility: Secondary | ICD-10-CM

## 2023-07-11 DIAGNOSIS — R531 Weakness: Secondary | ICD-10-CM

## 2023-07-11 DIAGNOSIS — M25612 Stiffness of left shoulder, not elsewhere classified: Secondary | ICD-10-CM

## 2023-07-13 NOTE — Telephone Encounter (Signed)
F2F [facility] Physical Therapy order submitted

## 2023-07-21 ENCOUNTER — Telehealth: Payer: Self-pay | Admitting: Family Medicine

## 2023-07-21 NOTE — Telephone Encounter (Signed)
Patient's daughter came in asking if her doctor can send in a referral to physical therapy. [facility] located at [employer], [place], [place].

## 2023-07-22 NOTE — Telephone Encounter (Signed)
Unfortunately, Medicare will not pay for the Physical Therapy unless I have seen Sharon Dodson within the last 3 months.  The referral will require a visit about the conditions requiring Physical Therapy prior to Medicare's coverage.  The visit does not have to be with Crissy Mccreadie, it may be with any physician within the [facility].  So, Sharon Dodson does not have to wait on an appointment with Adriaan Maltese to get the referral.   Please let Sharon Dodson, Sharon Dodson, know about this Medicare requirement.

## 2023-07-22 NOTE — Telephone Encounter (Signed)
[facility] PT with [facility] calls nurse line requesting verbal orders for [facility] PT evaluation.   VO given to [facility] per [facility] protocol.

## 2023-07-22 NOTE — Telephone Encounter (Signed)
Routed message to PCP. Jacorie Ernsberger, CMA  

## 2023-07-23 ENCOUNTER — Other Ambulatory Visit: Payer: Self-pay | Admitting: Family Medicine

## 2023-07-23 DIAGNOSIS — F01B18 Vascular dementia, moderate, with other behavioral disturbance: Secondary | ICD-10-CM

## 2023-07-28 ENCOUNTER — Telehealth: Payer: Self-pay

## 2023-07-28 NOTE — Telephone Encounter (Signed)
Doug from [facility] calling for PT verbal orders as follows:  1 time(s) weekly for 9 week(s), then OT evaluation.   Verbal orders given per [facility] protocol.   Gala Romney is also requesting that medication list be faxed to [phone]. He states that he is also going to contact daughter to schedule follow up with PCP.    Veronda Prude, RN

## 2023-08-18 ENCOUNTER — Ambulatory Visit: Payer: Medicare Other | Admitting: Family Medicine

## 2023-08-25 ENCOUNTER — Encounter: Payer: Self-pay | Admitting: Family Medicine

## 2023-08-25 ENCOUNTER — Ambulatory Visit: Payer: Medicare Other | Admitting: Family Medicine

## 2023-08-25 VITALS — BP 130/70 | HR 87 | Ht 62.0 in | Wt 159.1 lb

## 2023-08-25 DIAGNOSIS — F01518 Vascular dementia, unspecified severity, with other behavioral disturbance: Secondary | ICD-10-CM

## 2023-08-25 DIAGNOSIS — I4821 Permanent atrial fibrillation: Secondary | ICD-10-CM | POA: Diagnosis not present

## 2023-08-25 NOTE — Patient Instructions (Addendum)
Your are doing well.  Your blood pressure is improving. Great Job!  Take half a omeprazole tablet daily.    Stop lorazepam.

## 2023-08-25 NOTE — Progress Notes (Addendum)
Sharon Dodson is accompanied by daughter, Sharon Dodson Sources of clinical information for visit is/are patient and relative(s). Nursing assessment for this office visit was reviewed with the patient for accuracy and revision.     Previous Report(s) Reviewed: Verbal orders for home PT     08/25/2023   10:57 AM  Depression screen PHQ 2/9  Decreased Interest 0  Down, Depressed, Hopeless 0  PHQ - 2 Score 0  Altered sleeping 0  Tired, decreased energy 0  Change in appetite 0  Feeling bad or failure about yourself  0  Trouble concentrating 0  Moving slowly or fidgety/restless 0  Suicidal thoughts 0  PHQ-9 Score 0   Flowsheet Row Office Visit from 08/25/2023 in [facility] Of [place]. [facility] Office Visit from 03/18/2022 in [facility] - A Dept Of [facility]. [facility] Office Visit from 07/09/2021 in [facility] - [facility] Dept Of . [facility]  Thoughts that you would be better off dead, or of hurting yourself in some way Not at all Not at all Not at all  PHQ-9 Total Score 0 0 1          03/11/2023    4:26 PM 10/14/2022   10:57 AM 04/22/2022    2:24 PM 10/01/2021    1:50 PM 07/09/2021   11:24 AM  Fall Risk   Falls in the past year? 1 1 1 1 1   Number falls in past yr: 1 0  0 1  Injury with Fall? 1 1  1  0  Risk for fall due to : History of fall(s);Impaired balance/gait;Impaired mobility      Follow up Education provided;Falls prevention discussed;Falls evaluation completed           08/25/2023   10:57 AM 03/11/2023    4:25 PM 04/22/2022    2:24 PM  PHQ9 SCORE ONLY  PHQ-9 Total Score 0 0 0    There are no preventive care reminders to display for this patient.  Health Maintenance Due  Topic Date Due   DTaP/Tdap/Td (2 - Td or Tdap) 03/12/2016   COVID-19 Vaccine (7 - 2024-25 season) 08/01/2023      History/P.E. limitations: dementia  There are no preventive care reminders to display for  this patient. There are no preventive care reminders to display for this patient.  Health Maintenance Due  Topic Date Due   DTaP/Tdap/Td (2 - Td or Tdap) 03/12/2016   COVID-19 Vaccine (7 - 2024-25 season) 08/01/2023     Chief Complaint  Patient presents with   Medical Management of Chronic Issues    [place]     --------------------------------------------------------------------------------------------------------------------------------------------- Visit Problem List with A/P  Dementia, vascular, mixed, modrate severity, with behavioral disturbance (HCC) FAST stage 6 with progression to additional requirements for dressing and bathing, and greater difficulty with getting to commode and donning and doffing pull-ups.  Behavioral & Psychological Symptoms of Dementia: Sharon Dodson's anxious, repetitive questioning when visiting her daughter's home on the weekends did not improve with lorazepam, in fact, it may have increased the behavior.    Sharon Dodson understands the process of memory decline with its dissolution of working memory, its frustrating and possibly frightening experience for her mother, and how it leads to frequent repetition of sme questions and requests in order for her mother to try and get a sense of her situation/environment.   Will stop lorazepam as it was not  helpful, an possibly exacerbating.     Atrial fibrillation, permanent (HCC) Established problem Well Controlled. Patient is at goal of rate control with ongoing irregularly irregular rhythm. Sharon Dodson is taking the apixaban daily.   There seems to have been resolution of the brown, refluxed sputum with stopping the alendronate.   No signs of complications, medication side effects, or red flags. Recommended taking half the current omeprazole 40 mg daily. With goal of stopping at next office visit.

## 2023-08-26 ENCOUNTER — Encounter: Payer: Self-pay | Admitting: Family Medicine

## 2023-08-26 NOTE — Assessment & Plan Note (Signed)
Established problem Well Controlled. Patient is at goal of rate control with ongoing irregularly irregular rhythm. Sharon Dodson is taking the apixaban daily.   There seems to have been resolution of the brown, refluxed sputum with stopping the alendronate.   No signs of complications, medication side effects, or red flags. Recommended taking half the current omeprazole 40 mg daily. With goal of stopping at next office visit.

## 2023-08-26 NOTE — Assessment & Plan Note (Signed)
FAST stage 6 with progression to additional requirements for dressing and bathing, and greater difficulty with getting to commode and donning and doffing pull-ups.  Behavioral & Psychological Symptoms of Dementia: [name]'s anxious, repetitive questioning when visiting her daughter's home on the weekends did not improve with lorazepam, in fact, it may have increased the behavior.    [name] understands the process of memory decline with its dissolution of working memory, its frustrating and possibly frightening experience for her mother, and how it leads to frequent repetition of sme questions and requests in order for her mother to try and get a sense of her situation/environment.   Will stop lorazepam as it was not helpful, an possibly exacerbating.

## 2023-09-21 ENCOUNTER — Telehealth: Payer: Self-pay

## 2023-09-21 DIAGNOSIS — F01518 Vascular dementia, unspecified severity, with other behavioral disturbance: Secondary | ICD-10-CM

## 2023-09-21 DIAGNOSIS — F39 Unspecified mood [affective] disorder: Secondary | ICD-10-CM

## 2023-09-21 MED ORDER — DULOXETINE HCL 40 MG PO CPEP: 1.0000 | ORAL_CAPSULE | Freq: Every day | ORAL | 3 refills | Status: DC

## 2023-09-21 NOTE — Telephone Encounter (Signed)
 Patient's daughter LVM on nurse line regarding duloxetine  prescription.    She was asking to either increase prescription to 40 mg capsules or 60 mg capsules.   Patient has been taking 2 capsules of 20 mg duloxetine  daily.   Daughter reports that patient is out of 20 mg capsules.   Attempted to return call to daughter. No answer, LVM asking that she call back to office. Also sent mychart message.   Elsie Halo, RN

## 2023-11-21 ENCOUNTER — Other Ambulatory Visit: Payer: Self-pay | Admitting: Family Medicine

## 2023-11-21 DIAGNOSIS — Z8673 Personal history of transient ischemic attack (TIA), and cerebral infarction without residual deficits: Secondary | ICD-10-CM

## 2023-11-21 DIAGNOSIS — E7800 Pure hypercholesterolemia, unspecified: Secondary | ICD-10-CM

## 2023-11-22 ENCOUNTER — Ambulatory Visit (INDEPENDENT_AMBULATORY_CARE_PROVIDER_SITE_OTHER): Payer: Medicare Other | Admitting: Family Medicine

## 2023-11-22 ENCOUNTER — Encounter: Payer: Self-pay | Admitting: Family Medicine

## 2023-11-22 VITALS — BP 124/86 | Ht 62.0 in | Wt 156.2 lb

## 2023-11-22 DIAGNOSIS — Z7409 Other reduced mobility: Secondary | ICD-10-CM

## 2023-11-22 DIAGNOSIS — I4821 Permanent atrial fibrillation: Secondary | ICD-10-CM

## 2023-11-22 DIAGNOSIS — R54 Age-related physical debility: Secondary | ICD-10-CM

## 2023-11-22 DIAGNOSIS — K219 Gastro-esophageal reflux disease without esophagitis: Secondary | ICD-10-CM

## 2023-11-22 DIAGNOSIS — F01518 Vascular dementia, unspecified severity, with other behavioral disturbance: Secondary | ICD-10-CM

## 2023-11-22 NOTE — Patient Instructions (Signed)
 Your weight is going down.  Lost 3 pounds in the last 3 months.    Please two nutritional a day would be great. Calories and protein all together.   We will check some labs next office visit.

## 2023-11-22 NOTE — Progress Notes (Unsigned)
 Sharon Dodson is {Pc accompanied by:5710} Sources of clinical information for visit is/are {Information source:60032}. Nursing assessment for this office visit was reviewed with the patient for accuracy and revision.     Previous Report(s) Reviewed: {Outside review:15817}     08/25/2023   10:57 AM  Depression screen PHQ 2/9  Decreased Interest 0  Down, Depressed, Hopeless 0  PHQ - 2 Score 0  Altered sleeping 0  Tired, decreased energy 0  Change in appetite 0  Feeling bad or failure about yourself  0  Trouble concentrating 0  Moving slowly or fidgety/restless 0  Suicidal thoughts 0  PHQ-9 Score 0   Flowsheet Row Office Visit from 08/25/2023 in [facility] Of [place]. [facility] Office Visit from 03/18/2022 in [facility] - A Dept Of [facility]. [facility] Office Visit from 07/09/2021 in [facility] - A Dept Of [place]. [facility]  Thoughts that you would be better off dead, or of hurting yourself in some way Not at all Not at all Not at all  PHQ-9 Total Score 0 0 1          03/11/2023    4:26 PM 10/14/2022   10:57 AM 04/22/2022    2:24 PM 10/01/2021    1:50 PM 07/09/2021   11:24 AM  Fall Risk   Falls in the past year? 1 1 1 1 1   Number falls in past yr: 1 0  0 1  Injury with Fall? 1 1  1  0  Risk for fall due to : History of fall(s);Impaired balance/gait;Impaired mobility      Follow up Education provided;Falls prevention discussed;Falls evaluation completed           08/25/2023   10:57 AM 03/11/2023    4:25 PM 04/22/2022    2:24 PM  PHQ9 SCORE ONLY  PHQ-9 Total Score 0 0 0    There are no preventive care reminders to display for this patient.  Health Maintenance Due  Topic Date Due   DTaP/Tdap/Td (2 - Td or Tdap) 03/12/2016      History/P.E. limitations: {exam; limitations ed:60112}  There are no preventive care reminders to display for this patient. There are no preventive care  reminders to display for this patient.  Health Maintenance Due  Topic Date Due   DTaP/Tdap/Td (2 - Td or Tdap) 03/12/2016     No chief complaint on file.    --------------------------------------------------------------------------------------------------------------------------------------------- Visit Problem List with A/P  No problem-specific Assessment & Plan notes found for this encounter.

## 2023-11-23 ENCOUNTER — Encounter: Payer: Self-pay | Admitting: Family Medicine

## 2023-11-23 DIAGNOSIS — R54 Age-related physical debility: Secondary | ICD-10-CM | POA: Insufficient documentation

## 2023-11-23 NOTE — Assessment & Plan Note (Addendum)
 Established problem Ongoing gradual, unintentional weight loss. Wt Readings from Last 3 Encounters:  11/22/23 156 lb 4 oz (70.9 kg)  08/25/23 159 lb 2 oz (72.2 kg)  03/31/23 162 lb 6.4 oz (73.7 kg)   Appetite appears to be a contributor to the loss. This decrease has been going on for at least a year per my notes.   Suspect this is a manifestation of the frailty of dementia.  Recommend increase intake of nutritional supplements, especially if only a small portion of a meal is eaten.

## 2023-11-23 NOTE — Assessment & Plan Note (Signed)
 Established problem Mrs Sharon Dodson is walking with two-wheeled rolling walker in halls.

## 2023-11-23 NOTE — Assessment & Plan Note (Signed)
 Established problem Rate is control with persistent irregularly irregular rhythm 72 bpm Sharon Dodson is taking the apixaban daily.   Blood on toilet tissue - small amount.   No further brown sputum with stopping alendronate.  Sharon Dodson is no longer taknig omeprazole.    Recommend continuing Apixaban. Check hemoglobin next visit.

## 2023-11-23 NOTE — Assessment & Plan Note (Signed)
.  Resolved.  No significant indigestion or heartburn.  Sharon Dodson has been off omeprazole entirely per Ms Roselind Messier.   Agree with stopping the omeprazole and just use TUMS if needed for indigestion/heartburn symptoms.

## 2023-11-24 ENCOUNTER — Ambulatory Visit: Payer: Medicare Other | Admitting: Family Medicine

## 2024-01-26 ENCOUNTER — Ambulatory Visit: Admitting: Family Medicine

## 2024-02-16 ENCOUNTER — Telehealth: Payer: Self-pay | Admitting: Family Medicine

## 2024-02-16 ENCOUNTER — Ambulatory Visit (INDEPENDENT_AMBULATORY_CARE_PROVIDER_SITE_OTHER): Admitting: Family Medicine

## 2024-02-16 ENCOUNTER — Encounter: Payer: Self-pay | Admitting: Family Medicine

## 2024-02-16 VITALS — BP 112/77 | HR 69 | Ht 62.0 in | Wt 153.4 lb

## 2024-02-16 DIAGNOSIS — Z79899 Other long term (current) drug therapy: Secondary | ICD-10-CM | POA: Diagnosis not present

## 2024-02-16 DIAGNOSIS — I872 Venous insufficiency (chronic) (peripheral): Secondary | ICD-10-CM

## 2024-02-16 DIAGNOSIS — R945 Abnormal results of liver function studies: Secondary | ICD-10-CM

## 2024-02-16 DIAGNOSIS — K429 Umbilical hernia without obstruction or gangrene: Secondary | ICD-10-CM

## 2024-02-16 DIAGNOSIS — R634 Abnormal weight loss: Secondary | ICD-10-CM

## 2024-02-16 DIAGNOSIS — R0609 Other forms of dyspnea: Secondary | ICD-10-CM | POA: Diagnosis not present

## 2024-02-16 DIAGNOSIS — F01518 Vascular dementia, unspecified severity, with other behavioral disturbance: Secondary | ICD-10-CM | POA: Diagnosis not present

## 2024-02-16 LAB — POCT SEDIMENTATION RATE: POCT SED RATE: 8 mm/h (ref 0–22)

## 2024-02-16 NOTE — Telephone Encounter (Signed)
 Hisoty obtained from Mrs Phetteplace's daughter and primary caretaker, Ms Fredirick Jasmine.  Mrs Graffius was not present for this phone call.  Ms Rawland Caddy wants would like her parents to age is place in their current independent living part of the [facility].  Mrs Adeyemi needs one-to-one assistance with dressing, bathing, getting into bed, and toileting. She minimizes her need for support   Their is a CNA who comes for two hours in the morning to assist Mr and Mrs Eisenhardt with ADLs. Ms Samara Crest comes in the evening to prepare her parents for bed.  Mrs Minassian has a son who is supportive.  Mrs Lagerquist's memory and cognitive processing  continues to decline. Mrs Signer asks questions without apparent listening to for replies. Prompting she can remember some activities, though sometimes prompting does not help with recall, eg Mrs Nottingham does not recall having visited her daughter's home the day before.   Visits to her daughter's home require such degree of support from ms. Kincaid that the visit with famiily is limited.    She recognizes her son, but sometimes she does not recognizing her grandchildren  Huff- puff breathing pattern with exertion. Able to walk to and from dining hall twice a day using a walker. She has a group of friends with whom she eats.  Transthoracic echocardiogram 12/15/20 showed EF 60% w/mild AS (patient with 2/6 SEM) with IVC diameter variation < 50% c/w respiratory elevated RAP ~ 8 mmHg Mildly elevated pulmonary artery systolic  pressure. The estimated right ventricular systolic pressure is 38.5 mmHg.   Tricuspid valve regurgitation is mild to moderate.  Her nutritional intake remains diminished, saying she is full, though she will then eat chocolates and Reese cups.  It is not certain how much of the recommended two cans a day of a nutrition drink supplement she is actually drinking.  Seeee office visit for today for Assessment and Plan

## 2024-02-16 NOTE — Progress Notes (Signed)
 Sharon Dodson is accompanied by daughter, Fredirick Jasmine Sources of clinical information for visit is/are patient and relative(s). Nursing assessment for this office visit was reviewed with the patient for accuracy and revision.     Previous Report(s) Reviewed: none     02/16/2024   11:30 AM  Depression screen PHQ 2/9  Decreased Interest 0  Down, Depressed, Hopeless 0  PHQ - 2 Score 0  Altered sleeping 0  Tired, decreased energy 0  Change in appetite 0  Feeling bad or failure about yourself  0  Trouble concentrating 0  Moving slowly or fidgety/restless 0  Suicidal thoughts 0  PHQ-9 Score 0  Difficult doing work/chores Not difficult at all   [employer] Office Visit from 02/16/2024 in [facility] Of [place]. [facility] Office Visit from 08[facility]25/2023 in [facility] - [facility] Dept Of [place]. [facility] Office Visit from 03/18/2022 in [facility] - A Dept Of Tom[facility]ag[facility]. [facility]  Thoughts that you would be better off dead, or of hurting yourself in some way Not at all Not at all Not at all  PHQ-9 Total Score 0 0 0       11/22/2023   11:06 AM 03/11/2023    4:26 PM 10/14/2022   10:57 AM 04/22/2022    2:24 PM 10/01/2021    1:50 PM  Fall Risk   Falls in the past year? 0 1 1 1 1   Number falls in past yr: 0 1 0  0  Injury with Fall? 0 1 1  1   Risk for fall due to :  History of fall(s);Impaired balance/gait;Impaired mobility     Follow up  Education provided;Falls prevention discussed;Falls evaluation completed          02/16/2024   11:30 AM 08/25/2023   10:57 AM 03/11/2023    4:25 PM  PHQ9 SCORE ONLY  PHQ-9 Total Score 0 0 0    There are no preventive care reminders to display for this patient.  Health Maintenance Due  Topic Date Due   DTaP/Tdap/Td (2 - Td or Tdap) 03/12/2016   COVID-19 Vaccine (7 - Pfizer risk 2024-25 season) 12/04/2023   Medicare Annual Wellness (AWV)  03/10/2024       History/P.E. limitations: dementia with significant difficulty with short-term autobiographical memory  There are no preventive care reminders to display for this patient. There are no preventive care reminders to display for this patient.  Health Maintenance Due  Topic Date Due   DTaP/Tdap/Td (2 - Td or Tdap) 03/12/2016   COVID-19 Vaccine (7 - Pfizer risk 2024-25 season) 12/04/2023   Medicare Annual Wellness (AWV)  03/10/2024     Chief Complaint  Patient presents with   Medical Management of Chronic Issues     --------------------------------------------------------------------------------------------------------------------------------------------- Visit Problem List with A/P  No problem-specific Assessment & Plan notes found for this encounter.

## 2024-02-17 ENCOUNTER — Encounter: Payer: Self-pay | Admitting: Family Medicine

## 2024-02-17 DIAGNOSIS — R945 Abnormal results of liver function studies: Secondary | ICD-10-CM | POA: Insufficient documentation

## 2024-02-17 LAB — CBC

## 2024-02-17 NOTE — Assessment & Plan Note (Addendum)
 Established problem worsened.  Mrs Denard and Ms Rawland Caddy both believe her leg swelling has worsened recently. There was recent weeping over right shin that has resolved.  Ms Walter is most seated during the day.  Ms Rawland Caddy has noticed more huffing and puffing with exertion.  Able to walk to and from dining hall twice a day using a walker. She has a group of friends with whom she eats. No new leg pains.  She is taking her Eliquis . Patient with known history of bilateral leg edema with right leg circumference greater than left.   Transthoracic echocardiogram 12/15/20 showed EF 60% w/mild AS (patient with 2/6 SEM) with IVC diameter variation < 50% c/w respiratory elevated RAP ~ 8 mmHg Mildly elevated pulmonary artery systolic  pressure. The estimated right ventricular systolic pressure is 38.5 mmHg.   Tricuspid valve regurgitation is mild to moderate.   Exam Ext: 1(+) pitting edema over ankles and up to knees bilaterally.  Right calf circ > left calf circ. No tenderness.  No weeping, no skin breaks.  Hyperpigmentation at ankles without signs of inflammation.   Comprehensive Metabolic Panel:    Component Value Date/Time   NA 138 02/16/2024 1408   K 4.7 02/16/2024 1408   CL 101 02/16/2024 1408   CO2 25 02/16/2024 1408   BUN 20 02/16/2024 1408   CREATININE 0.71 02/16/2024 1408   GLUCOSE 88 02/16/2024 1408   GLUCOSE 95 02/13/2022 1531   CALCIUM  9.1 02/16/2024 1408   AST 47 (H) 02/16/2024 1408   ALT 37 (H) 02/16/2024 1408   ALKPHOS 132 (H) 02/16/2024 1408   BILITOT 1.2 02/16/2024 1408   PROT 6.5 02/16/2024 1408   ALBUMIN 3.9 02/16/2024 1408   BNP    Component Value Date/Time   BNP 663.5 (H) 02/16/2024 1408    Assessment and Plan Venous insufficient of bilateral legs, R>L  - Last transthoracic echocardiogram had signs of increased right heart pressures that may be contributing, in addition to the damaged veinous system of legs.  - Elevated BNP 663 c/w increased intraventricular  pressure/ventricular wall stress -  Preference is for leg elevation and compressive stocking therapy, over attempts of diuretic therapy and its not insignificant risks.  - Should significant skin damage from the stasis result despite compression, may try diuretics or compressive wraps.  - I suspect elevation in LFt may be due to hepatic congestion from right heart pressures.  - Need to discuss getting transthoracic echocardiogram with dgt

## 2024-02-17 NOTE — Assessment & Plan Note (Signed)
 Ongoing issue Wt Readings from Last 3 Encounters:  02/16/24 153 lb 6.4 oz (69.6 kg)  11/22/23 156 lb 4 oz (70.9 kg)  08/25/23 159 lb 2 oz (72.2 kg)   07/24              162 pounds  Nine pound weight loss in one year, three pounds over last three months. Five percent weight loss over the year.  The weight loss seems primarily due to decrease nutritional intake.   Sharon Dodson does not appear to be distressed.  While she has a sliding umbilical hernia, it is reducible, but returns with a cough.  No tenderness, no overlying erythema/calor  Will recheck weight loss labs to look for additional clues, but my suspicion is that the weight loss is a manifestation of her frailty syndrome.

## 2024-02-17 NOTE — Assessment & Plan Note (Signed)
 Established problem Both Ms Kipp's physical and cognitive functioning continues to decline.  The decline is placing more demands on her daughter to support her mother in place.   I discussed with Ms Rawland Caddy that her mother's needs will likely continue to increase. HOw Mrs Kincaid's children and husband wish to meet those needs will be a point of discussion for them.    See today's phone note with Ms Rawland Caddy for further details about Ms Bosko's current functional abilities.

## 2024-02-17 NOTE — Assessment & Plan Note (Signed)
 Established problem. Stable.  Umbilical hernia is reducible, but quickly returns to its herniated position. No signs of incarceration.   Given Sharon Dodson's frailty, I do not believe she would be a good candidate for hernia repair.  Will continue watching for now.   Symptoms requiring immediate medical attention were reviewed with Sharon Dodson.

## 2024-02-18 LAB — PE+INTERP(RFX IFE+FLC), S
A/G Ratio: 1 (ref 0.7–1.7)
Albumin ELP: 3.3 g/dL (ref 2.9–4.4)
Alpha 1: 0.3 g/dL (ref 0.0–0.4)
Alpha 2: 0.6 g/dL (ref 0.4–1.0)
Beta: 1 g/dL (ref 0.7–1.3)
Gamma Globulin: 1.3 g/dL (ref 0.4–1.8)
Globulin, Total: 3.2 g/dL (ref 2.2–3.9)

## 2024-02-18 LAB — CBC
Hematocrit: 41.6 % (ref 34.0–46.6)
Hemoglobin: 13 g/dL (ref 11.1–15.9)
MCH: 29.6 pg (ref 26.6–33.0)
MCHC: 31.3 g/dL — ABNORMAL LOW (ref 31.5–35.7)
MCV: 95 fL (ref 79–97)
Platelets: 224 x10E3/uL (ref 150–450)
RBC: 4.39 x10E6/uL (ref 3.77–5.28)
RDW: 13 % (ref 11.7–15.4)
WBC: 5.7 x10E3/uL (ref 3.4–10.8)

## 2024-02-18 LAB — CMP14+EGFR
ALT: 37 IU/L — ABNORMAL HIGH (ref 0–32)
AST: 47 IU/L — ABNORMAL HIGH (ref 0–40)
Albumin: 3.9 g/dL (ref 3.6–4.6)
Alkaline Phosphatase: 132 IU/L — ABNORMAL HIGH (ref 44–121)
BUN/Creatinine Ratio: 28 (ref 12–28)
BUN: 20 mg/dL (ref 10–36)
Bilirubin Total: 1.2 mg/dL (ref 0.0–1.2)
CO2: 25 mmol/L (ref 20–29)
Calcium: 9.1 mg/dL (ref 8.7–10.3)
Chloride: 101 mmol/L (ref 96–106)
Creatinine, Ser: 0.71 mg/dL (ref 0.57–1.00)
Globulin, Total: 2.6 g/dL (ref 1.5–4.5)
Glucose: 88 mg/dL (ref 70–99)
Potassium: 4.7 mmol/L (ref 3.5–5.2)
Sodium: 138 mmol/L (ref 134–144)
Total Protein: 6.5 g/dL (ref 6.0–8.5)
eGFR: 81 mL/min/1.73

## 2024-02-18 LAB — TSH: TSH: 2.39 u[IU]/mL (ref 0.450–4.500)

## 2024-02-18 LAB — BRAIN NATRIURETIC PEPTIDE: BNP: 663.5 pg/mL — ABNORMAL HIGH (ref 0.0–100.0)

## 2024-02-20 ENCOUNTER — Ambulatory Visit: Payer: Self-pay | Admitting: Family Medicine

## 2024-02-20 ENCOUNTER — Telehealth: Payer: Self-pay

## 2024-02-20 DIAGNOSIS — Z8673 Personal history of transient ischemic attack (TIA), and cerebral infarction without residual deficits: Secondary | ICD-10-CM

## 2024-02-20 DIAGNOSIS — M810 Age-related osteoporosis without current pathological fracture: Secondary | ICD-10-CM

## 2024-02-20 DIAGNOSIS — I4821 Permanent atrial fibrillation: Secondary | ICD-10-CM

## 2024-02-20 DIAGNOSIS — J309 Allergic rhinitis, unspecified: Secondary | ICD-10-CM

## 2024-02-20 DIAGNOSIS — R7989 Other specified abnormal findings of blood chemistry: Secondary | ICD-10-CM

## 2024-02-20 DIAGNOSIS — F39 Unspecified mood [affective] disorder: Secondary | ICD-10-CM

## 2024-02-20 DIAGNOSIS — R600 Localized edema: Secondary | ICD-10-CM

## 2024-02-20 DIAGNOSIS — E7800 Pure hypercholesterolemia, unspecified: Secondary | ICD-10-CM

## 2024-02-20 DIAGNOSIS — F01518 Vascular dementia, unspecified severity, with other behavioral disturbance: Secondary | ICD-10-CM

## 2024-02-20 DIAGNOSIS — R0602 Shortness of breath: Secondary | ICD-10-CM

## 2024-02-20 MED ORDER — PRAVASTATIN SODIUM 80 MG PO TABS
80.0000 mg | ORAL_TABLET | Freq: Every day | ORAL | 3 refills | Status: AC
Start: 2024-02-20 — End: ?

## 2024-02-20 MED ORDER — VITAMIN B-12 1000 MCG PO TABS: 1000.0000 ug | ORAL_TABLET | Freq: Every day | ORAL | 3 refills | Status: AC

## 2024-02-20 MED ORDER — LORATADINE 10 MG PO TABS
10.0000 mg | ORAL_TABLET | Freq: Every day | ORAL | 3 refills | Status: AC
Start: 2024-02-20 — End: ?

## 2024-02-20 MED ORDER — OYSTER SHELL CALCIUM/D3 500-5 MG-MCG PO TABS: 2.0000 | ORAL_TABLET | Freq: Every day | ORAL | 3 refills | Status: AC

## 2024-02-20 MED ORDER — DULOXETINE HCL 20 MG PO CPEP: 40.0000 mg | ORAL_CAPSULE | Freq: Every day | ORAL | 3 refills | Status: AC

## 2024-02-20 MED ORDER — APIXABAN 5 MG PO TABS: 5.0000 mg | ORAL_TABLET | Freq: Two times a day (BID) | ORAL | 3 refills | Status: DC

## 2024-02-20 NOTE — Telephone Encounter (Signed)
 I spoke with Ms Sharon Dodson about her mother's elevated BNP and her prior transthoracic echocardiogram showing increased right heart pressures.   I recommended we repeat the transthoracic echocardiogram to reassess ejection fraction and right heart pressures.  Changes might support the addition of a loop diuretic to Mrs Sharon Dodson's medication regiment.    Refills for all of Mrs Sharon Dodson's prescription medications [facility] prescribes on her med list were refilled with Rxs going to [facility]

## 2024-02-20 NOTE — Telephone Encounter (Signed)
 Hospital staff will reach out to the patient/daughter to have ECHO scheduled.

## 2024-02-20 NOTE — Telephone Encounter (Signed)
-----   Message from [facility] McDiarmid sent at 02/20/2024 11:52 AM EDT ----- Regarding: Schedule an echocardiogram Please schedule an transthoracic echocardiogram for Sharon Dodson.  Contact her daughter, Sharon Dodson [phone]), about the transthoracic echocardiogram appointment.

## 2024-02-22 ENCOUNTER — Other Ambulatory Visit: Payer: Self-pay

## 2024-02-22 DIAGNOSIS — I4821 Permanent atrial fibrillation: Secondary | ICD-10-CM

## 2024-02-22 MED ORDER — APIXABAN 5 MG PO TABS
5.0000 mg | ORAL_TABLET | Freq: Two times a day (BID) | ORAL | 3 refills | Status: AC
Start: 2024-02-22 — End: ?

## 2024-03-08 ENCOUNTER — Telehealth: Payer: Self-pay

## 2024-03-08 DIAGNOSIS — R052 Subacute cough: Secondary | ICD-10-CM

## 2024-03-08 NOTE — Telephone Encounter (Signed)
 Received VM from patient's daughter, Rhoda, requesting returned call.   Returned call to patient's daughter.   She reports that patient will have random episodes of spitting up large volumes of thick, phlegm. Several napkins worth. Not the best nutritional intake. Has been losing weight over the last year. (10-12 pounds in the last year). Denies fever or cough. She states that another family member asked if she could be possibly forgetting to swallow.  ECHO scheduled 03/14/24- daughter has reservations about this. States that even with what we would find out from ECHO, they do not want to do Lasix. Are there any other benefits/ reasons that provider would want to do ECHO?  Advised that I would forward message to provider for review and next steps.   Chiquita JAYSON English, RN

## 2024-03-12 NOTE — Telephone Encounter (Signed)
 I spoke with Ms Kincaid about a concenr she has for her mother.  Mrs Sako has had several episodes at meals of gagging up large volumes of clear phlegm. There is no coughing, no food particles.  No nasal congestion, runny nose, post-nasal drip.  (+) history of allergic rhinitis. No history of smoking. No history pulmonary disease.    A/  Uncertain origin of phlegm.  - Doubt Upper GI origin given phlegm like quality and no food particles in phlegm.  - Given gagging would suspect upper respiratory tract process, but there are no confirmatory upper respiratory tract symptoms.  - If lower respiratory process, could be bronchiectasis, foreign body aspiration, pulmonary edema,   Plan Transthoracic echocardiogram  CXR

## 2024-03-14 ENCOUNTER — Ambulatory Visit (HOSPITAL_COMMUNITY)

## 2024-03-15 ENCOUNTER — Ambulatory Visit: Payer: Medicare Other

## 2024-03-15 VITALS — Ht 62.0 in | Wt 168.0 lb

## 2024-03-15 DIAGNOSIS — Z0000 Encounter for general adult medical examination without abnormal findings: Secondary | ICD-10-CM | POA: Diagnosis not present

## 2024-03-15 NOTE — Patient Instructions (Addendum)
 Ms. Cliburn , Thank you for taking time out of your busy schedule to complete your Annual Wellness Visit with me. I enjoyed our conversation and look forward to speaking with you again next year. I, as well as your care team,  appreciate your ongoing commitment to your health goals. Please review the following plan we discussed and let me know if I can assist you in the future. Your Game plan/ To Do List    Referrals: If you haven't heard from the office you've been referred to, please reach out to them at the phone provided.   Follow up Visits: Next Medicare AWV with our clinical staff: 03/18/2025 at 4:20 p.m. phone visit with Nurse Health Advisor   Have you seen your provider in the last 6 months (3 months if uncontrolled diabetes)? Yes Next office Visit with your provider: Daughter will call office to schedule as needed.  Clinician Recommendations:  Aim for 30 minutes of exercise or brisk walking, 6-8 glasses of water, and 5 servings of fruits and vegetables each day.       This is a list of the screening recommended for you and due dates:  Health Maintenance  Topic Date Due   DTaP/Tdap/Td vaccine (2 - Td or Tdap) 03/12/2016   COVID-19 Vaccine (7 - Pfizer risk 2024-25 season) 12/04/2023   Flu Shot  04/06/2024   Medicare Annual Wellness Visit  03/15/2025   Pneumococcal Vaccine for age over 37  Completed   DEXA scan (bone density measurement)  Completed   Zoster (Shingles) Vaccine  Completed   Hepatitis B Vaccine  Aged Out   HPV Vaccine  Aged Out   Meningitis B Vaccine  Aged Out    Advanced directives: (In Chart) A copy of your advanced directives are scanned into your chart should your provider ever need it. Advance Care Planning is important because it:  [x]  Makes sure you receive the medical care that is consistent with your values, goals, and preferences  [x]  It provides guidance to your family and loved ones and reduces their decisional burden about whether or not they are making  the right decisions based on your wishes.  Follow the link provided in your after visit summary or read over the paperwork we have mailed to you to help you started getting your Advance Directives in place. If you need assistance in completing these, please reach out to us  so that we can help you!  See attachments for Preventive Care and Fall Prevention Tips.

## 2024-03-15 NOTE — Progress Notes (Signed)
 Because this visit was a virtual/telehealth visit,  certain criteria was not obtained, such a blood pressure, CBG if applicable, and timed get up and go. Any medications not marked as taking were not mentioned during the medication reconciliation part of the visit. Any vitals not documented were not able to be obtained due to this being a telehealth visit or patient was unable to self-report a recent blood pressure reading due to a lack of equipment at home via telehealth. Vitals that have been documented are verbally provided by the patient.   Subjective:   Sharon Dodson is a 89 y.o. who presents for a Medicare Wellness preventive visit.  As a reminder, Annual Wellness Visits don't include a physical exam, and some assessments may be limited, especially if this visit is performed virtually. We may recommend an in-person follow-up visit with your provider if needed.  Visit Complete: Virtual I connected with  Sharon Dodson on 03/15/24 by a audio enabled telemedicine application and verified that I am speaking with the correct person using two identifiers.  Patient Location: Home  Provider Location: Home Office  I discussed the limitations of evaluation and management by telemedicine. The patient expressed understanding and agreed to proceed.  Vital Signs: Because this visit was a virtual/telehealth visit, some criteria may be missing or patient reported. Any vitals not documented were not able to be obtained and vitals that have been documented are patient reported.  VideoDeclined- This patient declined Librarian, academic. Therefore the visit was completed with audio only.  Persons Participating in Visit: Patient assisted by Sharon Dodson (daughter).  AWV Questionnaire: No: Patient Medicare AWV questionnaire was not completed prior to this visit.  Cardiac Risk Factors include: advanced age (>61men, >51 women);obesity (BMI >30kg/m2);sedentary  lifestyle;dyslipidemia;hypertension     Objective:    Today's Vitals   03/15/24 1416  Weight: 168 lb (76.2 kg)  Height: 5' 2 (1.575 m)  PainSc: 0-No pain   Body mass index is 30.73 kg/m.     03/15/2024    2:48 PM 02/16/2024   11:33 AM 08/25/2023   10:55 AM 03/11/2023    4:27 PM 04/22/2022    2:22 PM 02/13/2022    3:33 PM 07/09/2021   11:24 AM  Advanced Directives  Does Patient Have a Medical Advance Directive? Yes No Yes Yes Yes No Yes  Type of Estate agent of [place];Living will  Living will Healthcare Power of [place];Living will Healthcare Power of [place];Living will  Healthcare Power of Attorney  Does patient want to make changes to medical advance directive? No - Patient declined  No - Guardian declined No - Patient declined     Copy of Healthcare Power of Attorney in Chart? Yes - validated most recent copy scanned in chart (See row information)   Yes - validated most recent copy scanned in chart (See row information) Yes - validated most recent copy scanned in chart (See row information)  Yes - validated most recent copy scanned in chart (See row information)  Would patient like information on creating a medical advance directive? No - Patient declined No - Patient declined    No - Patient declined     Current Medications (verified) Outpatient Encounter Medications as of 03/15/2024  Medication Sig   apixaban  (ELIQUIS ) 5 MG TABS tablet Take 1 tablet (5 mg total) by mouth 2 (two) times daily.   calcium -vitamin D  (OSCAL WITH D) 500-5 MG-MCG tablet Take 2 tablets by mouth daily with breakfast.   cyanocobalamin (VITAMIN  B12) 1000 MCG tablet Take 1 tablet (1,000 mcg total) by mouth daily.   DULoxetine  (CYMBALTA ) 20 MG capsule Take 2-3 capsules (40-60 mg total) by mouth daily.   Incontinence Supply Disposable (COMFORT SHIELD ADULT DIAPERS) MISC 1 each by Does not apply route 6 (six) times daily.   loratadine  (CLARITIN ) 10 MG tablet Take 1 tablet (10 mg total)  by mouth daily.   Multiple Vitamins-Minerals (PRESERVISION AREDS 2) CAPS Take 1 capsule by mouth daily.   Nutritional Supplements (ENSURE HIGH PROTEIN) LIQD Take 1 Can by mouth daily.   pravastatin  (PRAVACHOL ) 80 MG tablet Take 1 tablet (80 mg total) by mouth daily.   No facility-administered encounter medications on file as of 03/15/2024.    Allergies (verified) Sulfa antibiotics, Sulfasalazine, and Hydrocodone   History: Past Medical History:  Diagnosis Date   Accidental fall 05/23/2021   Allergic rhinitis 07/02/2014   Anemia 07/02/2014   Ankle edema, bilateral 05/02/2020   Likely venous insufficiency   Atrial fibrillation, permanent (HCC) 11/14/2020   Benign essential HTN 05/01/2020   Cerebral infarction (HCC) 07/27/2010   COVID 05/23/2021   Decreased oral intake 10/14/2022   Decreased range of motion of left shoulder 05/02/2020   Dizziness 03/19/2022   Environmental and seasonal allergies 01/18/2020   Fall 04/13/2021   Falls 04/13/2021   Gait abnormality 01/09/2021   GERD (gastroesophageal reflux disease) 07/27/2010   H/O reduction of closed dislocation 12/06/2012   Hallux abductovalgus with bunions 01/09/2021   History of cerebral infarction 05/01/2020   Formatting of this note might be different from the original. 07/2010, RIGHT parietal lobe, without residual effects   Hypercholesterolemia 07/02/2014   Insomnia 07/02/2014   LVH (left ventricular hypertrophy) 01/08/2021   Found on transthoracic echocardiogram 11/2020   Mood disorder (HCC) 04/27/2021   Nocturia 05/01/2020   Osteoporosis 07/02/2014   Pure hypercholesterolemia 05/01/2020   RCT (rotator cuff tear) 12/21/2012   Shoulder dislocation 10/16/2012   Uncomplicated asthma 05/01/2020   Unsteady gait 07/02/2014   Urgency incontinence 07/02/2014   Venous insufficiency 07/02/2014   Venous stasis dermatitis of right lower extremity 06/03/2020   Ventral hernia, sliding 10/03/2020   History reviewed. No  pertinent surgical history. Family History  Adopted: Yes  Family history unknown: Yes   Social History   Socioeconomic History   Marital status: Married    Spouse name: Not on file   Number of children: Not on file   Years of education: >16    Highest education level: Master's degree (e.g., MA, MS, MEng, MEd, MSW, MBA)  Occupational History   Occupation: Doctor, general practice    Comment: retired  Tobacco Use   Smoking status: Never   Smokeless tobacco: Never  Substance and Sexual Activity   Alcohol use: Not on file   Drug use: Not on file   Sexual activity: Not on file  Other Topics Concern   Not on file  Social History Narrative   Not on file   Social Drivers of Health   Financial Resource Strain: Low Risk  (03/15/2024)   Overall Financial Resource Strain (CARDIA)    Difficulty of Paying Living Expenses: Not hard at all  Food Insecurity: No Food Insecurity (03/15/2024)   Hunger Vital Sign    Worried About Running Out of Food in the Last Year: Never true    Ran Out of Food in the Last Year: Never true  Transportation Needs: No Transportation Needs (03/15/2024)   PRAPARE - Administrator, Civil Service (Medical): No  Lack of Transportation (Non-Medical): No  Physical Activity: Inactive (03/15/2024)   Exercise Vital Sign    Days of Exercise per Week: 0 days    Minutes of Exercise per Session: 0 min  Stress: No Stress Concern Present (03/15/2024)   [employer] of Occupational Health - Occupational Stress Questionnaire    Feeling of Stress: Not at all  Social Connections: Moderately Integrated (03/15/2024)   Social Connection and Isolation Panel    Frequency of Communication with Friends and Family: More than three times a week    Frequency of Social Gatherings with Friends and Family: Three times a week    Attends Religious Services: 1 to 4 times per year    Active Member of Clubs or Organizations: No    Attends Banker Meetings: Never     Marital Status: Married    Tobacco Counseling Counseling given: Not Answered    Clinical Intake:  Pre-visit preparation completed: Yes  Pain : No/denies pain Pain Score: 0-No pain     BMI - recorded: 30.73 Nutritional Status: BMI > 30  Obese Nutritional Risks: None Diabetes: No  No results found for: HGBA1C   How often do you need to have someone help you when you read instructions, pamphlets, or other written materials from your doctor or pharmacy?: 1 - Never  Interpreter Needed?: No  Information entered by :: Ellyn Rubiano N. Stanly Si, LPN.   Activities of Daily Living     03/15/2024    2:48 PM  In your present state of health, do you have any difficulty performing the following activities:  Hearing? 1  Vision? 1  Difficulty concentrating or making decisions? 1  Walking or climbing stairs? 1  Dressing or bathing? 1  Doing errands, shopping? 1  Preparing Food and eating ? Y  Using the Toilet? Y  In the past six months, have you accidently leaked urine? Y  Do you have problems with loss of bowel control? N  Managing your Medications? Y  Managing your Finances? Y  Housekeeping or managing your Housekeeping? Y    Patient Care Team: McDiarmid, Krystal BIRCH, MD as PCP - General (Family Medicine)  I have updated your Care Teams any recent Medical Services you may have received from other providers in the past year.     Assessment:   This is a routine wellness examination for Sharon Dodson.  Hearing/Vision screen Hearing Screening - Comments:: Patient has hearing difficulty and wears hearing aids. Vision Screening - Comments:: Patient does wear corrective lenses.  Annual eye exam done by: [facility]    Goals Addressed             This Visit's Progress    Client understands the importance of follow-up with providers by attending scheduled visits.       Prevent falls.       Depression Screen     03/15/2024    2:50 PM 02/16/2024   11:30 AM 08/25/2023   10:57  AM 03/11/2023    4:25 PM 04/22/2022    2:24 PM 03/18/2022   10:17 AM 07/09/2021   11:24 AM  PHQ 2/9 Scores  PHQ - 2 Score  0 0 0 0 0 0  PHQ- 9 Score  0 0   0 1  Exception Documentation Other- indicate reason in comment box        Not completed Patient is cognitive impaired.          Fall Risk     03/15/2024  2:48 PM 11/22/2023   11:06 AM 03/11/2023    4:26 PM 10/14/2022   10:57 AM 04/22/2022    2:24 PM  Fall Risk   Falls in the past year? 0 0 1 1 1   Number falls in past yr: 0 0 1 0   Injury with Fall? 0 0 1 1   Risk for fall due to : No Fall Risks  History of fall(s);Impaired balance/gait;Impaired mobility    Follow up Falls evaluation completed  Education provided;Falls prevention discussed;Falls evaluation completed      MEDICARE RISK AT HOME:  Medicare Risk at Home Any stairs in or around the home?: No If so, are there any without handrails?: No Home free of loose throw rugs in walkways, pet beds, electrical cords, etc?: Yes Adequate lighting in your home to reduce risk of falls?: Yes Life alert?: Yes Use of a cane, walker or w/c?: Yes Grab bars in the bathroom?: Yes Shower chair or bench in shower?: Yes Elevated toilet seat or a handicapped toilet?: Yes  TIMED UP AND GO:  Was the test performed?  No  Cognitive Function: Impaired: Patient has current diagnosis of cognitive impairment.    03/15/2024    2:51 PM  MMSE - Mini Mental State Exam  Not completed: Unable to complete        Immunizations Immunization History  Administered Date(s) Administered   Fluad Quad(high Dose 65+) 06/25/2003, 06/20/2010, 06/08/2022, 06/06/2023   Influenza Split 06/03/2018, 05/17/2019, 05/19/2021   Influenza, High Dose Seasonal PF 05/21/2015, 05/15/2016, 05/11/2017   Influenza, Seasonal, Injecte, Preservative Fre 06/25/2003, 06/20/2010   Influenza,inj,Quad PF,6+ Mos 05/29/2020   PFIZER Comirnaty(Gray Top)Covid-19 Tri-Sucrose Vaccine 01/08/2021   PFIZER(Purple Top)SARS-COV-2  Vaccination 10/11/2019, 11/02/2019, 05/29/2020   Pfizer Covid-19 Vaccine Bivalent Booster 34yrs & up 05/19/2021   Pfizer(Comirnaty)Fall Seasonal Vaccine 12 years and older 06/06/2023   Pneumococcal Conjugate-13 07/31/2014   Pneumococcal Polysaccharide-23 06/20/2010   Tdap 03/12/2006   Zoster Recombinant(Shingrix) 06/09/2018, 10/05/2018    Screening Tests Health Maintenance  Topic Date Due   DTaP/Tdap/Td (2 - Td or Tdap) 03/12/2016   COVID-19 Vaccine (7 - Pfizer risk 2024-25 season) 12/04/2023   INFLUENZA VACCINE  04/06/2024   Medicare Annual Wellness (AWV)  03/15/2025   Pneumococcal Vaccine: 50+ Years  Completed   DEXA SCAN  Completed   Zoster Vaccines- Shingrix  Completed   Hepatitis B Vaccines  Aged Out   HPV VACCINES  Aged Out   Meningococcal B Vaccine  Aged Out    Health Maintenance  Health Maintenance Due  Topic Date Due   DTaP/Tdap/Td (2 - Td or Tdap) 03/12/2016   COVID-19 Vaccine (7 - Pfizer risk 2024-25 season) 12/04/2023   Health Maintenance Items Addressed: Yes Patient's daughter aware of current care gaps.  Patient is due for Dtap and Covid vaccines.  Additional Screening:  Vision Screening: Recommended annual ophthalmology exams for early detection of glaucoma and other disorders of the eye. Would you like a referral to an eye doctor? No    Dental Screening: Recommended annual dental exams for proper oral hygiene  Community Resource Referral / Chronic Care Management: CRR required this visit?  No   CCM required this visit?  No   Plan:    I have personally reviewed and noted the following in the patient's chart:   Medical and social history Use of alcohol, tobacco or illicit drugs  Current medications and supplements including opioid prescriptions. Patient is not currently taking opioid prescriptions. Functional ability and status Nutritional status Physical  activity Advanced directives List of other physicians Hospitalizations, surgeries, and ER  visits in previous 12 months Vitals Screenings to include cognitive, depression, and falls Referrals and appointments  In addition, I have reviewed and discussed with patient certain preventive protocols, quality metrics, and best practice recommendations. A written personalized care plan for preventive services as well as general preventive health recommendations were provided to patient.   Roz LOISE Fuller, LPN   [id number]/[id number]   After Visit Summary: (MyChart) Due to this being a telephonic visit, the after visit summary with patients personalized plan was offered to patient via MyChart   Notes: Patient's daughter aware of current care gaps.  Patient is due for Dtap and Covid vaccines.

## 2024-03-22 ENCOUNTER — Encounter: Payer: Self-pay | Admitting: Family Medicine

## 2024-04-18 ENCOUNTER — Ambulatory Visit (HOSPITAL_COMMUNITY)
Admission: RE | Admit: 2024-04-18 | Discharge: 2024-04-18 | Disposition: A | Discharge status: Home / Self Care | Source: Ambulatory Visit | Attending: Family Medicine | Admitting: Family Medicine

## 2024-04-18 DIAGNOSIS — R0600 Dyspnea, unspecified: Secondary | ICD-10-CM | POA: Diagnosis present

## 2024-04-18 DIAGNOSIS — R7989 Other specified abnormal findings of blood chemistry: Secondary | ICD-10-CM | POA: Insufficient documentation

## 2024-04-18 DIAGNOSIS — R0602 Shortness of breath: Secondary | ICD-10-CM | POA: Diagnosis not present

## 2024-04-18 DIAGNOSIS — R052 Subacute cough: Secondary | ICD-10-CM

## 2024-04-18 DIAGNOSIS — R600 Localized edema: Secondary | ICD-10-CM | POA: Insufficient documentation

## 2024-04-18 DIAGNOSIS — I4891 Unspecified atrial fibrillation: Secondary | ICD-10-CM | POA: Diagnosis not present

## 2024-04-18 DIAGNOSIS — I080 Rheumatic disorders of both mitral and aortic valves: Secondary | ICD-10-CM | POA: Insufficient documentation

## 2024-04-18 LAB — ECHOCARDIOGRAM COMPLETE
AR max vel: 0.9 cm2
AV Area VTI: 0.86 cm2
AV Area mean vel: 0.86 cm2
AV Mean grad: 30 mmHg
AV Peak grad: 42.3 mmHg
Ao pk vel: 3.25 m/s
Est EF: 55
MV VTI: 1.35 cm2
S' Lateral: 3.1 cm

## 2024-04-22 ENCOUNTER — Encounter: Payer: Self-pay | Admitting: Family Medicine

## 2024-04-23 ENCOUNTER — Ambulatory Visit (INDEPENDENT_AMBULATORY_CARE_PROVIDER_SITE_OTHER): Admitting: Family Medicine

## 2024-04-23 ENCOUNTER — Encounter: Payer: Self-pay | Admitting: Family Medicine

## 2024-04-23 ENCOUNTER — Telehealth: Payer: Self-pay

## 2024-04-23 ENCOUNTER — Telehealth: Payer: Self-pay | Admitting: Family Medicine

## 2024-04-23 VITALS — BP 120/80 | HR 83 | Temp 98.3°F | Resp 20

## 2024-04-23 DIAGNOSIS — R4182 Altered mental status, unspecified: Secondary | ICD-10-CM

## 2024-04-23 DIAGNOSIS — R54 Age-related physical debility: Secondary | ICD-10-CM | POA: Diagnosis not present

## 2024-04-23 DIAGNOSIS — I350 Nonrheumatic aortic (valve) stenosis: Secondary | ICD-10-CM

## 2024-04-23 DIAGNOSIS — R0602 Shortness of breath: Secondary | ICD-10-CM | POA: Insufficient documentation

## 2024-04-23 DIAGNOSIS — I340 Nonrheumatic mitral (valve) insufficiency: Secondary | ICD-10-CM | POA: Insufficient documentation

## 2024-04-23 NOTE — Telephone Encounter (Signed)
-----   Message from [facility] McDiarmid sent at 04/23/2024 10:07 AM EDT ----- Regarding: Schedule an appointment for this afternoon Please schedule Mrs Ibbotson an acute visit this afternoon (8/18). I will see her.  Thank you,  Krystal

## 2024-04-23 NOTE — Patient Instructions (Addendum)
 I believe your breathlessness is from your heart not pumping well.   Your aortic valve is thickened and narrowed.  It is limiting the amount of blood that your heart can pump to the rest of your body.  This will make you feel tired and breathless.    Unfortunately, we will have to ask you to go across the street to [facility] to have your blood drawn.    Dr Darrelyn Morro will be in contact with your heart doctors to see if they believe your breathlessness and fatigue are coming from your heart and what treatments we should consider.   I am going to ask [facility]  to contact you and your family to discuss goals of care and future options.    Keep taking your medications as you are.

## 2024-04-23 NOTE — Telephone Encounter (Signed)
 I spoke with Sharon Dodson, Mrs Doss's daughter, about her concerns for her mother.  Starting about one week ago [name] became weaker, walking much shorted distances than usual. Spenind most of day in bed.  Becomes tired quickly when walking. Onset was before [name] had her transthoracic echocardiogram and CXR on 04/18/24.  Decrease in appetite and oral intake of food and liquids.  Clemens 04/21/24.  Speech/Language/cognition worsened from baseline.   Daughter check patient with OTC urine dip stick as her daughter thought this was how Sharon Wilhide was previously before she seen for ED visit, 02/14/24 for dizziness and treated for E coli UTI with Cephalexin . (+) Dipstick per daughter.  Level of care for patient has increased.  Patient needing assist dressing, bathing, toileting, ambulating.   Transthoracic echocardiogram 04/18/24 showed EF 55%. Severe AS valve area 0.86 cm^2 and gradient 30 mmHg.  Moderate MV regurg Mildly reduced right ventricular function with RV pressure 52 mmHg.   Bilateral atrial dialation.  IVC dilated with <50% respiratory variability c/w RAP 15 mmHg.   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  Fall 2 days ago - ?head? (+) Dipstick Permanent Atrial Fibrillation  P/ Recommend [name] be evaluated at [facility] today ?Cardiac (Low CO and increased right-sided pressure) ? R/O intracranial given fall and apixaban   Others: Metabolic/Infectious/neurologic Retention, thyroid

## 2024-04-23 NOTE — Telephone Encounter (Signed)
Patient is coming in today at 1pm

## 2024-04-23 NOTE — Assessment & Plan Note (Addendum)
 Established problem worsened.  Patient is not at goal of symptom control  See TC with Ms Burnie, daughter, from today for further details of illness  Main symptoms are breathlessness, easy fatigability. She had a fall a couple days ago in her kitchen.  Patient uncertain if passed out or not.  SIL helped her get up. Likely hit her back and left shoulder. She reports she hit back of her head without pain.   Decreased po intake over last week.    No dysuria/urgency/frequncy/fever/SP pain No abdomin pain.  No new back pain.  No headache.  No new limb weakness.  Vitals:   04/23/24 1410  BP: 120/80  Pulse: 83  Temp: 98.3 F (36.8 C)  SpO2: 95%   Wt Readings from Last 3 Encounters:  03/15/24 168 lb (76.2 kg)  02/16/24 153 lb 6.4 oz (69.6 kg)  11/22/23 156 lb 4 oz (70.9 kg)    HEENT: no bruising or hematomas of scalp. Pupils 2-3 mm bilat symm and reactive. Decreased intensity of carotid artery pulse bilaterally.  Cor: irr irr, loud systolic murmur LUSB with rad bilater carotids,  Lung: BCTA, tachypnic after rising then sitting in chair Abdomin: soft, NT, ND, (+) BS, no SP tenderness or palpable SP dome, ventral midline hernia soft, NT, easily reducible.  Ext: pitting 1-2+ bilateral edema to mid-shins Neuro: Sit-stand without dizziness, able stand with legs apart with sway but no loss of balance, (-) rhomberg Strength: 5/5 UE/LE bilaterally symmetric except left shoulder - able to hold against gravity. Gait: petite ped, shuffle, quickly fatigued with standing.  Tachypnea persisted for many minutes after exertion.  MSK: Bruising midline thoracolumbar back, non-tender to percussion  CXR (2V): old finding of cardiomegaly, no sign of volume overload  A/ Concern that Mrs Kocian's aortic stenosis is the source of her shortness of breath with minimal exertion from inadequate cardiac output.   There was dec IVC inspir var and RVF on transthoracic echocardiogram C/W right heart  failure Weight is up nearly 10% in last month.   Lab: slight increase TB and Alk Phos c/w cardiohepatic congestion P/ Discuss of limited prognosis of severe AV stenosis with heart failure-like symptoms with daughter, son-in-law and patient  Referral to hospice for GOC and palliative care, and discussion of hospice enrollment option.   Addendum 04/25/24 Start Lasix  20 mg daily for volume overload

## 2024-04-23 NOTE — Progress Notes (Signed)
 Sharon Dodson is accompanied by her Son-in-Law Sources of clinical information for visit is/are relative(s). Sharon Dodson, daughter, was present by phone Nursing assessment for this office visit was reviewed with the patient for accuracy and revision.   Previous Report(s) Reviewed: 04/18/24 transthoracic echocardiogram report and visual review of CXR and its report.      02/16/2024   11:30 AM  Depression screen PHQ 2/9  Decreased Interest 0  Down, Depressed, Hopeless 0  PHQ - 2 Score 0  Altered sleeping 0  Tired, decreased energy 0  Change in appetite 0  Feeling bad or failure about yourself  0  Trouble concentrating 0  Moving slowly or fidgety/restless 0  Suicidal thoughts 0  PHQ-9 Score 0  Difficult doing work/chores Not difficult at all   [employer] Office Visit from 02/16/2024 in [facility] - [facility] Dept Of [place]. [facility] Office Visit from 08/25/2023 in [facility] - [facility] [place] Office Visit from 03/18/2022 in [facility] - [facility] Of [place] [facility]. [facility]  Thoughts that you would be better off dead, or of hurting yourself in some way Not at all Not at all Not at all  PHQ-9 Total Score 0 0 0       04/23/2024    2:11 PM 03/15/2024    2:48 PM 11/22/2023   11:06 AM 03/11/2023    4:26 PM 10/14/2022   10:57 AM  Fall Risk   Falls in the past year? 1 0 0 1 1  Number falls in past yr: 0 0 0 1 0  Injury with Fall? 0 0 0 1 1  Risk for fall due to :  No Fall Risks  History of fall(s);Impaired balance/gait;Impaired mobility   Follow up  Falls evaluation completed  Education provided;Falls prevention discussed;Falls evaluation completed        02/16/2024   11:30 AM 08/25/2023   10:57 AM 03/11/2023    4:25 PM  PHQ9 SCORE ONLY  PHQ-9 Total Score 0 0 0    There are no preventive care reminders to display for this patient.  Health Maintenance Due  Topic Date Due   DTaP/Tdap/Td (2 - Td or Tdap)  03/12/2016   COVID-19 Vaccine (7 - Pfizer risk 2024-25 season) 12/04/2023   INFLUENZA VACCINE  04/06/2024      History/P.E. limitations: dementia  There are no preventive care reminders to display for this patient. There are no preventive care reminders to display for this patient.  Health Maintenance Due  Topic Date Due   DTaP/Tdap/Td (2 - Td or Tdap) 03/12/2016   COVID-19 Vaccine (7 - Pfizer risk 2024-25 season) 12/04/2023   INFLUENZA VACCINE  04/06/2024     Chief Complaint  Patient presents with   Shortness of Breath     Discussed the use of AI scribe software for clinical note transcription with the patient, who gave verbal consent to proceed.  History of Present Illness  Please see TC with Sharon Dodson from today for further details of recent illness.  --------------------------------------------------------------------------------------------------------------------------------------------- Visit Problem List with Assessment and Plan   Assessment and Plan Assessment & Plan  Aortic stenosis, severe Established problem worsened.  Patient is not at goal of symptom control  See TC with Sharon Dodson, daughter, from today for further details of illness  Main symptoms are breathlessness, easy fatigability. She had a fall a couple days ago in her kitchen.  Patient uncertain if passed out or not.  SIL helped her get up. Likely hit her back and left shoulder. She reports she hit back of her head without pain.   Decreased po intake over last week.    No dysuria/urgency/frequncy/fever/SP pain No abdomin pain.  No new back pain.  No headache.  No new limb weakness.  Vitals:   04/23/24 1410  BP: 120/80  Pulse: 83  Temp: 98.3 F (36.8 C)  SpO2: 95%   HEENT: no bruising or hematomas of scalp. Pupils 2-3 mm bilat symm and reactive. Decreased intensity of carotid artery pulse bilaterally.  Cor: irr irr, loud systolic murmur LUSB with rad bilater carotids,  Lung: BCTA,  tachypnic after rising then sitting in chair Abdomin: soft, NT, ND, (+) BS, no SP tenderness or palpable SP dome, ventral midline hernia soft, NT, easily reducible.  Ext: pitting 1-2+ bilateral edema to mid-shins Neuro: Sit-stand without dizziness, able stand with legs apart with sway but no loss of balance, (-) rhomberg Strength: 5/5 UE/LE bilaterally symmetric except left shoulder - able to hold against gravity. Gait: petite ped, shuffle, quickly fatigued with standing.  Tachypnea persisted for many minutes after exertion.  MSK: Bruising midline thoracolumbar back, non-tender to percussion  A/ Concern that [name]'s aortic stenosis is the source of her shortness of breath with minimal exertion from inadequate cardiac output.   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  P/ Discuss of limited prognosis of severe AV stenosis with heart failure-like symptoms with daughter, son-in-law and patient  Referral to hospice for GOC and palliative care, and discussion of hospice enrollment option.       CPT E&M Office Visit Time Before Visit; reviewing medical records (e.g. recent visits, labs, studies): 10 minutes During Visit (F2F time): 20 minutes After Visit (discussion with family or HCP, prescribing, ordering, referring, calling result/recommendations or documenting on same day): 5 minutes Total Visit Time: 35 minutes

## 2024-04-24 ENCOUNTER — Ambulatory Visit: Payer: Self-pay | Admitting: Family Medicine

## 2024-04-24 DIAGNOSIS — I350 Nonrheumatic aortic (valve) stenosis: Secondary | ICD-10-CM

## 2024-04-24 LAB — CBC
Hematocrit: 41.3 % (ref 34.0–46.6)
Hemoglobin: 13.2 g/dL (ref 11.1–15.9)
MCH: 29.7 pg (ref 26.6–33.0)
MCHC: 32 g/dL (ref 31.5–35.7)
MCV: 93 fL (ref 79–97)
Platelets: 236 x10E3/uL (ref 150–450)
RBC: 4.45 x10E6/uL (ref 3.77–5.28)
RDW: 13.5 % (ref 11.7–15.4)
WBC: 7.2 x10E3/uL (ref 3.4–10.8)

## 2024-04-24 LAB — CMP14+EGFR
ALT: 28 IU/L (ref 0–32)
AST: 35 IU/L (ref 0–40)
Albumin: 3.8 g/dL (ref 3.6–4.6)
Alkaline Phosphatase: 148 IU/L — ABNORMAL HIGH (ref 44–121)
BUN/Creatinine Ratio: 40 — ABNORMAL HIGH (ref 12–28)
BUN: 27 mg/dL (ref 10–36)
Bilirubin Total: 1.3 mg/dL — ABNORMAL HIGH (ref 0.0–1.2)
CO2: 21 mmol/L (ref 20–29)
Calcium: 9.1 mg/dL (ref 8.7–10.3)
Chloride: 103 mmol/L (ref 96–106)
Creatinine, Ser: 0.67 mg/dL (ref 0.57–1.00)
Globulin, Total: 3 g/dL (ref 1.5–4.5)
Glucose: 88 mg/dL (ref 70–99)
Potassium: 4.3 mmol/L (ref 3.5–5.2)
Sodium: 140 mmol/L (ref 134–144)
Total Protein: 6.8 g/dL (ref 6.0–8.5)
eGFR: 83 mL/min/1.73 (ref >59)

## 2024-04-24 LAB — TSH: TSH: 1.78 u[IU]/mL (ref 0.450–4.500)

## 2024-04-25 LAB — URINALYSIS

## 2024-04-25 MED ORDER — FUROSEMIDE 20 MG PO TABS: 20.0000 mg | ORAL_TABLET | Freq: Every day | ORAL | 3 refills | Status: DC

## 2024-04-25 NOTE — Telephone Encounter (Signed)
 I spoke with Ms Burnie by phone about her mother's  Comprehensive Metabolic Panel/CBC/TSH results from 04/23/24.   Mentioned the slight elevation in TB and Al phos may represent cardiohepatic congestion.  Ms Burnie has arranged appointment with [facility] to discuss GOC and care options, ranging from home palliative care check-in visits to full home hospice enrollment.   A/ Severe AS with MR/MS & RHF            - Associated dyspnea with ADL exertions and prolonged recovery tachypnea Working diagnosis of acute RHF with venous congestion  P/ Start Lasix  20 mg by mouth daily     - Monitoring SBP and LOC Follow-up [facility] consultation with patient and family

## 2024-04-26 ENCOUNTER — Encounter: Payer: Self-pay | Admitting: Cardiovascular Disease

## 2024-04-26 ENCOUNTER — Ambulatory Visit: Attending: Cardiovascular Disease | Admitting: Cardiovascular Disease

## 2024-04-26 ENCOUNTER — Ambulatory Visit: Admitting: Family Medicine

## 2024-04-26 ENCOUNTER — Other Ambulatory Visit (HOSPITAL_COMMUNITY): Payer: Self-pay

## 2024-04-26 VITALS — BP 122/83 | HR 79 | Ht 62.0 in | Wt 150.0 lb

## 2024-04-26 DIAGNOSIS — I350 Nonrheumatic aortic (valve) stenosis: Secondary | ICD-10-CM | POA: Diagnosis not present

## 2024-04-26 DIAGNOSIS — I5033 Acute on chronic diastolic (congestive) heart failure: Secondary | ICD-10-CM | POA: Diagnosis present

## 2024-04-26 MED ORDER — POTASSIUM CHLORIDE ER 10 MEQ PO TBCR
10.0000 meq | EXTENDED_RELEASE_TABLET | Freq: Every day | ORAL | 3 refills | Status: AC
  Filled 2024-04-26: qty 90, 90d supply, fill #0

## 2024-04-26 MED ORDER — FUROSEMIDE 20 MG PO TABS
ORAL_TABLET | ORAL | 0 refills | Status: AC
  Filled 2024-04-26: qty 96, 93d supply, fill #0

## 2024-04-26 NOTE — Patient Instructions (Signed)
 Medication Instructions:  START Lasix  40 mg once daily for 3 days, THEN decrease to Lasix  20 mg once daily   START Potassium 10 mEq once daily  *If you need a refill on your cardiac medications before your next appointment, please call your pharmacy*  Lab Work: None ordered today. If you have labs (blood work) drawn today and your tests are completely normal, you will receive your results only by: MyChart Message (if you have MyChart) OR A paper copy in the mail If you have any lab test that is abnormal or we need to change your treatment, we will call you to review the results.  Testing/Procedures: None ordered today.  Follow-Up: At [facility], you and your health needs are our priority.  As part of our continuing mission to provide you with exceptional heart care, our providers are all part of one team.  This team includes your primary Cardiologist (physician) and Advanced Practice Providers or APPs (Physician Assistants and Nurse Practitioners) who all work together to provide you with the care you need, when you need it.  Your next appointment:   As needed  Provider:   Dr.Cooper

## 2024-04-26 NOTE — Progress Notes (Signed)
 Cardiology Office Note:    Date:  04/26/2024   ID:  Jyoti Harju, DOB 07-25-1932, MRN [id number]  PCP:  McDiarmid, Krystal BIRCH, MD   [facility] Providers Cardiologist:  None     Referring MD: McDiarmid, Krystal BIRCH, MD   Chief Complaint  Patient presents with   Shortness of Breath    History of Present Illness:    Sharon Dodson is a 89 y.o. female referred by Dr Alveta for evaluation of aortic stenosis.  The patient is here with her daughter and her caregiver today.  She is married and lives in an independent living facility.  She has known aortic stenosis which was mild at the time of last evaluation in 2022.  A repeat echo was just done last week demonstrating progressive findings of severe aortic stenosis.  Her LVEF is 55%, RV function is mildly reduced and pulmonary artery pressures are moderately elevated with an estimated right ventricular systolic pressure of 53 mmHg.  There is moderate mitral regurgitation and stenosis and severe aortic stenosis with a mean gradient of 30 mmHg and a calculated valve area of 0.86 cm.  From a symptomatic perspective, the patient has declined significantly in the last few weeks.  She has exhibited significant memory impairment with known dementia.  However, her cognition has worsened and she has become much more short of breath and fatigue.  Her appetite has been decreased.  She is short of breath with minimal activity and also short of breath in conversation.  She has had leg swelling that has not changed much recently.  They were evaluated by their primary care physician on August 18 and the patient was felt to have potential symptoms of severe aortic stenosis and right heart failure.  A palliative approach of care was recommended in light of her advanced age and multiple comorbid conditions.  They present today for further evaluation to see if there are any other treatment options for her.  Past Medical History:  Diagnosis Date   Accidental fall  05/23/2021   Allergic rhinitis 07/02/2014   Anemia 07/02/2014   Ankle edema, bilateral 05/02/2020   Likely venous insufficiency   Atrial fibrillation, permanent (HCC) 11/14/2020   Benign essential HTN 05/01/2020   Cerebral infarction (HCC) 07/27/2010   COVID 05/23/2021   Decreased oral intake 10/14/2022   Decreased range of motion of left shoulder 05/02/2020   Dizziness 03/19/2022   Edema of both lower extremities due to peripheral venous insufficiency 06/03/2020   Environmental and seasonal allergies 01/18/2020   Fall 04/13/2021   Falls 04/13/2021   Gait abnormality 01/09/2021   GERD (gastroesophageal reflux disease) 07/27/2010   H/O reduction of closed dislocation 12/06/2012   Hallux abductovalgus with bunions 01/09/2021   History of cerebral infarction 05/01/2020   Formatting of this note might be different from the original. 07/2010, RIGHT parietal lobe, without residual effects   Hypercholesterolemia 07/02/2014   Insomnia 07/02/2014   LVH (left ventricular hypertrophy) 01/08/2021   Found on transthoracic echocardiogram 11/2020   Mood disorder (HCC) 04/27/2021   Nocturia 05/01/2020   Osteoporosis 07/02/2014   Pure hypercholesterolemia 05/01/2020   RCT (rotator cuff tear) 12/21/2012   Shoulder dislocation 10/16/2012   Uncomplicated asthma 05/01/2020   Unsteady gait 07/02/2014   Urgency incontinence 07/02/2014   Venous insufficiency 07/02/2014   Venous stasis dermatitis of right lower extremity 06/03/2020   Ventral hernia, sliding 10/03/2020    History reviewed. No pertinent surgical history.  Current Medications: Current Meds  Medication Sig  apixaban  (ELIQUIS ) 5 MG TABS tablet Take 1 tablet (5 mg total) by mouth 2 (two) times daily.   calcium -vitamin D  (OSCAL WITH D) 500-5 MG-MCG tablet Take 2 tablets by mouth daily with breakfast.   cyanocobalamin (VITAMIN B12) 1000 MCG tablet Take 1 tablet (1,000 mcg total) by mouth daily.   DULoxetine  (CYMBALTA ) 20 MG capsule  Take 2-3 capsules (40-60 mg total) by mouth daily.   Incontinence Supply Disposable (COMFORT SHIELD ADULT DIAPERS) MISC 1 each by Does not apply route 6 (six) times daily.   loratadine  (CLARITIN ) 10 MG tablet Take 1 tablet (10 mg total) by mouth daily.   Multiple Vitamins-Minerals (PRESERVISION AREDS 2) CAPS Take 1 capsule by mouth daily.   Nutritional Supplements (ENSURE HIGH PROTEIN) LIQD Take 1 Can by mouth daily.   potassium chloride  (KLOR-CON ) 10 MEQ tablet Take 1 tablet (10 mEq total) by mouth daily.   pravastatin  (PRAVACHOL ) 80 MG tablet Take 1 tablet (80 mg total) by mouth daily.   [DISCONTINUED] furosemide  (LASIX ) 20 MG tablet Take 1 tablet (20 mg total) by mouth daily.     Allergies:   Sulfa antibiotics, Sulfasalazine, and Hydrocodone   Social History   Socioeconomic History   Marital status: Married    Spouse name: Not on file   Number of children: Not on file   Years of education: >16    Highest education level: Master's degree (e.g., MA, MS, MEng, MEd, MSW, MBA)  Occupational History   Occupation: Doctor, general practice    Comment: retired  Tobacco Use   Smoking status: Never   Smokeless tobacco: Never  Substance and Sexual Activity   Alcohol use: Not on file   Drug use: Not on file   Sexual activity: Not on file  Other Topics Concern   Not on file  Social History Narrative   Not on file   Social Drivers of Health   Financial Resource Strain: Medium Risk (04/22/2024)   Overall Financial Resource Strain (CARDIA)    Difficulty of Paying Living Expenses: Somewhat hard  Food Insecurity: No Food Insecurity (04/22/2024)   Hunger Vital Sign    Worried About Running Out of Food in the Last Year: Never true    Ran Out of Food in the Last Year: Never true  Transportation Needs: Unmet Transportation Needs (04/22/2024)   PRAPARE - Administrator, Civil Service (Medical): Yes    Lack of Transportation (Non-Medical): No  Physical Activity: Inactive (04/22/2024)    Exercise Vital Sign    Days of Exercise per Week: 0 days    Minutes of Exercise per Session: Not on file  Stress: No Stress Concern Present (04/22/2024)   [employer] of Occupational Health - Occupational Stress Questionnaire    Feeling of Stress: Only a little  Social Connections: Moderately Integrated (04/22/2024)   Social Connection and Isolation Panel    Frequency of Communication with Friends and Family: Twice a week    Frequency of Social Gatherings with Friends and Family: Twice a week    Attends Religious Services: 1 to 4 times per year    Active Member of [employer] or Organizations: No    Attends Engineer, structural: Not on file    Marital Status: Married     Family History: The patient's She was adopted. Family history is unknown by patient.  ROS:   Please see the history of present illness.    All other systems reviewed and are negative.  EKGs/Labs/Other Studies Reviewed:    The  following studies were reviewed today: Cardiac Studies & Procedures   ______________________________________________________________________________________________     ECHOCARDIOGRAM  ECHOCARDIOGRAM COMPLETE 04/18/2024  Narrative ECHOCARDIOGRAM REPORT    Patient Name:   REESHA DEBES Date of Exam: 04/18/2024 Medical Rec #:  [id number]   Height:       62.0 in Accession #:    [id number]  Weight:       168.0 lb Date of Birth:  1932-06-13   BSA:          1.775 m Patient Age:    89 years    BP:           122/77 mmHg Patient Gender: F           HR:           72 bpm. Exam Location:  Inpatient  Procedure: 2D Echo, Color Doppler and Cardiac Doppler (Both Spectral and Color Flow Doppler were utilized during procedure).  Indications:    Dyspnea R06.00  History:        Patient has prior history of Echocardiogram examinations, most recent 12/15/2020.  Sonographer:    Tinnie Gosling RDCS Referring Phys: [id number] TODD D MCDIARMID  IMPRESSIONS   1. Left ventricular ejection fraction,  by estimation, is 55%. The left ventricle has normal function. The left ventricle has no regional wall motion abnormalities. Left ventricular diastolic parameters are indeterminate. 2. Right ventricular systolic function is mildly reduced. The right ventricular size is mildly enlarged. There is moderately elevated pulmonary artery systolic pressure. The estimated right ventricular systolic pressure is 52.5 mmHg. 3. Left atrial size was severely dilated. 4. Right atrial size was moderately dilated. 5. The mitral valve is degenerative. Moderate mitral valve regurgitation. Moderate mitral stenosis. The mean mitral valve gradient is 6.0 mmHg, MVA 1.35 cm^2. Moderate to severe mitral annular calcification. 6. The aortic valve is tricuspid. There is severe calcifcation of the aortic valve. Aortic valve regurgitation is not visualized. Severe aortic valve stenosis. Aortic valve area, by VTI measures 0.86 cm. Aortic valve mean gradient measures 30.0 mmHg. 7. Aortic dilatation noted. There is mild dilatation of the ascending aorta, measuring 41 mm. 8. The inferior vena cava is dilated in size with <50% respiratory variability, suggesting right atrial pressure of 15 mmHg. 9. The patient was in atrial fibrillation.  FINDINGS Left Ventricle: Left ventricular ejection fraction, by estimation, is 55%. The left ventricle has normal function. The left ventricle has no regional wall motion abnormalities. The left ventricular internal cavity size was normal in size. There is no left ventricular hypertrophy. Left ventricular diastolic parameters are indeterminate.  Right Ventricle: The right ventricular size is mildly enlarged. No increase in right ventricular wall thickness. Right ventricular systolic function is mildly reduced. There is moderately elevated pulmonary artery systolic pressure. The tricuspid regurgitant velocity is 3.06 m/s, and with an assumed right atrial pressure of 15 mmHg, the estimated right  ventricular systolic pressure is 52.5 mmHg.  Left Atrium: Left atrial size was severely dilated.  Right Atrium: Right atrial size was moderately dilated.  Pericardium: There is no evidence of pericardial effusion.  Mitral Valve: The mitral valve is degenerative in appearance. There is moderate thickening of the mitral valve leaflet(s). There is moderate calcification of the mitral valve leaflet(s). Moderate to severe mitral annular calcification. Moderate mitral valve regurgitation. Moderate mitral valve stenosis. The mean mitral valve gradient is 6.0 mmHg.  Tricuspid Valve: The tricuspid valve is normal in structure. Tricuspid valve regurgitation is mild.  Aortic Valve: The aortic valve is  tricuspid. There is severe calcifcation of the aortic valve. Aortic valve regurgitation is not visualized. Severe aortic stenosis is present. Aortic valve mean gradient measures 30.0 mmHg. Aortic valve peak gradient measures 42.3 mmHg. Aortic valve area, by VTI measures 0.86 cm.  Pulmonic Valve: The pulmonic valve was normal in structure. Pulmonic valve regurgitation is not visualized.  Aorta: The aortic root is normal in size and structure and aortic dilatation noted. There is mild dilatation of the ascending aorta, measuring 41 mm.  Venous: The inferior vena cava is dilated in size with less than 50% respiratory variability, suggesting right atrial pressure of 15 mmHg.  IAS/Shunts: No atrial level shunt detected by color flow Doppler.   LEFT VENTRICLE PLAX 2D LVIDd:         5.10 cm   Diastology LVIDs:         3.10 cm   LV e' medial:  2.94 cm/s LV PW:         1.10 cm   LV e' lateral: 5.55 cm/s LV IVS:        1.10 cm LVOT diam:     2.00 cm LV SV:         61 LV SV Index:   35 LVOT Area:     3.14 cm   RIGHT VENTRICLE             IVC RV S prime:     15.00 cm/s  IVC diam: 2.30 cm TAPSE (M-mode): 1.0 cm  LEFT ATRIUM              Index        RIGHT ATRIUM           Index LA diam:        4.50  cm  2.53 cm/m   RA Area:     20.80 cm LA Vol (A2C):   118.0 ml 66.47 ml/m  RA Volume:   54.90 ml  30.93 ml/m LA Vol (A4C):   114.0 ml 64.22 ml/m LA Biplane Vol: 117.0 ml 65.91 ml/m AORTIC VALVE AV Area (Vmax):    0.90 cm AV Area (Vmean):   0.86 cm AV Area (VTI):     0.86 cm AV Vmax:           325.33 cm/s AV Vmean:          247.333 cm/s AV VTI:            0.715 m AV Peak Grad:      42.3 mmHg AV Mean Grad:      30.0 mmHg LVOT Vmax:         92.70 cm/s LVOT Vmean:        67.400 cm/s LVOT VTI:          0.195 m LVOT/AV VTI ratio: 0.27  AORTA Ao Root diam: 2.50 cm Ao Asc diam:  4.10 cm  MITRAL VALVE            TRICUSPID VALVE MV Area VTI:  1.35 cm  TR Peak grad:   37.5 mmHg MV Mean grad: 6.0 mmHg  TR Vmax:        306.00 cm/s MV VTI:       0.45 m SHUNTS Systemic VTI:  0.20 m Systemic Diam: 2.00 cm  Dalton McleanMD Electronically signed by Ezra Kanner Signature Date/Time: 04/18/2024/4:43:38 PM    Final          ______________________________________________________________________________________________      EKG Interpretation Date/Time:  Thursday April 26 2024 09:18:38  EDT Ventricular Rate:  79 PR Interval:    QRS Duration:  126 QT Interval:  380 QTC Calculation: 435 R Axis:   -50  Text Interpretation: Atrial fibrillation with a competing junctional pacemaker Left axis deviation Left bundle branch block When compared with ECG of 13-Feb-2022 15:25, Left bundle branch block is now Present Minimal criteria for Anterior infarct are no longer Present Confirmed by Wonda Sharper [id number]) on 04/26/2024 9:32:14 AM    Recent Labs: 02/16/2024: BNP 663.5 04/23/2024: ALT 28; BUN 27; Creatinine, Ser 0.67; Hemoglobin 13.2; Platelets 236; Potassium 4.3; Sodium 140; TSH 1.780  Recent Lipid Panel    Component Value Date/Time   CHOL 128 05/24/2021 0127   CHOL 158 07/10/2020 1203   TRIG 72 05/24/2021 0127   HDL 60 05/24/2021 0127   HDL 71 07/10/2020 1203   CHOLHDL  2.1 05/24/2021 0127   VLDL 14 05/24/2021 0127   LDLCALC 54 05/24/2021 0127   LDLCALC 73 07/10/2020 1203           Physical Exam:    VS:  BP 122/83 (BP Location: Left Arm, Patient Position: Sitting)   Pulse 79   Ht 5' 2 (1.575 m)   Wt 150 lb (68 kg)   SpO2 92%   BMI 27.44 kg/m     Wt Readings from Last 3 Encounters:  04/26/24 150 lb (68 kg)  03/15/24 168 lb (76.2 kg)  02/16/24 153 lb 6.4 oz (69.6 kg)     GEN: Delightful, elderly woman in no acute distress HEENT: Normal NECK: Moderate JVD; No carotid bruits LYMPHATICS: No lymphadenopathy CARDIAC: Irregularly irregular with a 3/6 harsh late peaking crescendo decrescendo murmur at the right upper sternal border RESPIRATORY:  Clear to auscultation without rales, wheezing or rhonchi  ABDOMEN: Soft, non-tender, non-distended MUSCULOSKELETAL:  No edema; No deformity  SKIN: Warm and dry NEUROLOGIC:  Alert and oriented x 3 PSYCHIATRIC:  Normal affect   ASSESSMENT:    1. Severe aortic stenosis   2. Acute on chronic heart failure with preserved ejection fraction (HCC)   3. Aortic stenosis, severe    PLAN:    In order of problems listed above:  The patient has severe, stage D1 aortic stenosis complicated by progressive heart failure with NYHA functional class IV symptoms of shortness of breath at rest, decreased energy level, inability to walk, and worsening cognitive function. Pt's echo reviewed and shows LVEF 55%, mild RV dilatation and mild RV dysfunction. There is moderate MR/MS, Mild TR, and Severe AS with mean gradient 30 mmHg and AVA 0.86 square cm.   I have reviewed the natural history of aortic stenosis with the patient and their family members who are present today. We have discussed the limitations of medical therapy and the poor prognosis associated with symptomatic aortic stenosis. We have reviewed potential treatment options, including palliative medical therapy, conventional surgical aortic valve replacement, and  transcatheter aortic valve replacement. We discussed treatment options in the context of the patient's specific comorbid medical conditions.  Considering her advanced age and comorbidities with fairly marked decline in functional capacity, I am in agreement that a palliative approach to her care is indicated.  I think that she would have a very high risk of complication with attempts at working her up for TAVR and going through the TAVR operation.  I would not consider her an appropriate candidate for this and I think she would have better quality through a supportive and palliative treatment plan.  We specifically discussed hospice and I emphasized  the support that they provide will help her maintain her dignity and quality of life as her symptoms progress.  They are scheduled for hospice consultation next week.  I wrote her prescription for furosemide  40 mg daily for 3 days then 20 mg daily thereafter.  Will supplement her with 10 mill equivalents of K-Dur daily.  Otherwise they understand there is really no medical treatment for her progressive aortic stenosis.       Medication Adjustments/Labs and Tests Ordered: Current medicines are reviewed at length with the patient today.  Concerns regarding medicines are outlined above.  Orders Placed This Encounter  Procedures   EKG 12-Lead   Meds ordered this encounter  Medications   potassium chloride  (KLOR-CON ) 10 MEQ tablet    Sig: Take 1 tablet (10 mEq total) by mouth daily.    Dispense:  90 tablet    Refill:  3   furosemide  (LASIX ) 20 MG tablet    Sig: Take 2 tablets (40 mg total) by mouth daily for 3 days, THEN 1 tablet (20 mg total) daily.    Dispense:  96 tablet    Refill:  0    Tapered dose updated, 40 mg x3 days, then 20 mg to follow    Patient Instructions  Medication Instructions:  START Lasix  40 mg once daily for 3 days, THEN decrease to Lasix  20 mg once daily   START Potassium 10 mEq once daily  *If you need a refill on your  cardiac medications before your next appointment, please call your pharmacy*  Lab Work: None ordered today. If you have labs (blood work) drawn today and your tests are completely normal, you will receive your results only by: MyChart Message (if you have MyChart) OR A paper copy in the mail If you have any lab test that is abnormal or we need to change your treatment, we will call you to review the results.  Testing/Procedures: None ordered today.  Follow-Up: At [facility], you and your health needs are our priority.  As part of our continuing mission to provide you with exceptional heart care, our providers are all part of one team.  This team includes your primary Cardiologist (physician) and Advanced Practice Providers or APPs (Physician Assistants and Nurse Practitioners) who all work together to provide you with the care you need, when you need it.  Your next appointment:   As needed  Provider:   Dr.Jeriyah Granlund    Signed, Ozell Fell, MD  04/26/2024 11:32 AM    [facility]

## 2024-11-22 ENCOUNTER — Ambulatory Visit: Admitting: Family Medicine

## 2025-03-18 ENCOUNTER — Encounter
